# Patient Record
Sex: Male | Born: 1940 | Race: White | Hispanic: No | Marital: Single | Smoking: Former smoker
Health system: Southern US, Community
[De-identification: ages and names within clinical notes are randomized; demographics above are authoritative.]

## PROBLEM LIST (undated history)

## (undated) DIAGNOSIS — I2510 Atherosclerotic heart disease of native coronary artery without angina pectoris: Secondary | ICD-10-CM

## (undated) DIAGNOSIS — R001 Bradycardia, unspecified: Secondary | ICD-10-CM

## (undated) DIAGNOSIS — C801 Malignant (primary) neoplasm, unspecified: Secondary | ICD-10-CM

## (undated) DIAGNOSIS — I10 Essential (primary) hypertension: Secondary | ICD-10-CM

## (undated) DIAGNOSIS — Z87898 Personal history of other specified conditions: Secondary | ICD-10-CM

## (undated) DIAGNOSIS — E7800 Pure hypercholesterolemia, unspecified: Secondary | ICD-10-CM

## (undated) DIAGNOSIS — IMO0001 Reserved for inherently not codable concepts without codable children: Secondary | ICD-10-CM

## (undated) DIAGNOSIS — Z8719 Personal history of other diseases of the digestive system: Secondary | ICD-10-CM

## (undated) DIAGNOSIS — E119 Type 2 diabetes mellitus without complications: Secondary | ICD-10-CM

## (undated) DIAGNOSIS — E041 Nontoxic single thyroid nodule: Secondary | ICD-10-CM

## (undated) HISTORY — PX: COLONOSCOPY WITH ESOPHAGOGASTRODUODENOSCOPY (EGD): SHX5779

## (undated) HISTORY — PX: COLON SURGERY: SHX602

## (undated) HISTORY — PX: CORONARY ANGIOPLASTY WITH STENT PLACEMENT: SHX49

## (undated) HISTORY — PX: OTHER SURGICAL HISTORY: SHX169

## (undated) HISTORY — PX: HERNIA REPAIR: SHX51

---

## 2008-01-30 ENCOUNTER — Ambulatory Visit: Payer: Self-pay | Admitting: Family Medicine

## 2009-11-28 ENCOUNTER — Ambulatory Visit: Payer: Self-pay | Admitting: Family Medicine

## 2010-01-17 ENCOUNTER — Ambulatory Visit: Payer: Self-pay | Admitting: Gastroenterology

## 2010-02-03 ENCOUNTER — Ambulatory Visit: Payer: Self-pay | Admitting: Gastroenterology

## 2010-08-01 ENCOUNTER — Ambulatory Visit: Payer: Self-pay | Admitting: Internal Medicine

## 2011-04-03 ENCOUNTER — Ambulatory Visit: Payer: Self-pay | Admitting: Family Medicine

## 2012-08-05 ENCOUNTER — Ambulatory Visit: Payer: Self-pay | Admitting: Ophthalmology

## 2012-11-17 ENCOUNTER — Ambulatory Visit: Payer: Self-pay | Admitting: Internal Medicine

## 2012-11-19 ENCOUNTER — Ambulatory Visit: Payer: Self-pay | Admitting: Internal Medicine

## 2012-11-20 LAB — BASIC METABOLIC PANEL
Anion Gap: 5 — ABNORMAL LOW (ref 7–16)
Calcium, Total: 8.7 mg/dL (ref 8.5–10.1)
Chloride: 105 mmol/L (ref 98–107)
Co2: 26 mmol/L (ref 21–32)
EGFR (African American): >60
Osmolality: 271 (ref 275–301)
Potassium: 4 mmol/L (ref 3.5–5.1)
Sodium: 136 mmol/L (ref 136–145)

## 2012-11-20 LAB — CK TOTAL AND CKMB (NOT AT ARMC): CK-MB: 6.4 ng/mL — ABNORMAL HIGH (ref 0.5–3.6)

## 2013-09-14 ENCOUNTER — Emergency Department (HOSPITAL_COMMUNITY)
Admission: EM | Admit: 2013-09-14 | Discharge: 2013-09-14 | Disposition: A | Discharge status: Home / Self Care | Payer: Medicare Other | Attending: Emergency Medicine | Admitting: Emergency Medicine

## 2013-09-14 ENCOUNTER — Emergency Department (HOSPITAL_COMMUNITY): Payer: Medicare Other

## 2013-09-14 ENCOUNTER — Encounter (HOSPITAL_COMMUNITY): Payer: Self-pay | Admitting: Emergency Medicine

## 2013-09-14 DIAGNOSIS — Z87891 Personal history of nicotine dependence: Secondary | ICD-10-CM | POA: Insufficient documentation

## 2013-09-14 DIAGNOSIS — E119 Type 2 diabetes mellitus without complications: Secondary | ICD-10-CM | POA: Insufficient documentation

## 2013-09-14 DIAGNOSIS — Z7982 Long term (current) use of aspirin: Secondary | ICD-10-CM | POA: Diagnosis not present

## 2013-09-14 DIAGNOSIS — Z9861 Coronary angioplasty status: Secondary | ICD-10-CM | POA: Insufficient documentation

## 2013-09-14 DIAGNOSIS — I10 Essential (primary) hypertension: Secondary | ICD-10-CM | POA: Insufficient documentation

## 2013-09-14 DIAGNOSIS — M7732 Calcaneal spur, left foot: Secondary | ICD-10-CM

## 2013-09-14 DIAGNOSIS — Z7902 Long term (current) use of antithrombotics/antiplatelets: Secondary | ICD-10-CM | POA: Insufficient documentation

## 2013-09-14 DIAGNOSIS — M79609 Pain in unspecified limb: Secondary | ICD-10-CM | POA: Diagnosis present

## 2013-09-14 DIAGNOSIS — M7730 Calcaneal spur, unspecified foot: Secondary | ICD-10-CM | POA: Insufficient documentation

## 2013-09-14 DIAGNOSIS — Z79899 Other long term (current) drug therapy: Secondary | ICD-10-CM | POA: Diagnosis not present

## 2013-09-14 HISTORY — DX: Essential (primary) hypertension: I10

## 2013-09-14 HISTORY — DX: Type 2 diabetes mellitus without complications: E11.9

## 2013-09-14 MED ORDER — TRAMADOL HCL 50 MG PO TABS: 50.0000 mg | ORAL_TABLET | Freq: Four times a day (QID) | ORAL | Status: DC | PRN

## 2013-09-14 NOTE — ED Notes (Signed)
Pt reports left foot x 1 month, states he has been seen by PCP and given "exercises" to do to help pain. Pt states pain has continued to get worse. Pt denies any recent injury

## 2013-09-14 NOTE — Discharge Instructions (Signed)
Heel Spur °A heel spur is a hook of bone that can form on the calcaneus (the heel bone and the largest bone of the foot). Heel spurs are often associated with plantar fasciitis and usually come in people who have had the problem for an extended period of time. The cause of the relationship is unknown. The pain associated with them is thought to be caused by an inflammation (soreness and redness) of the plantar fascia rather than the spur itself. The plantar fascia is a thick fibrous like tissue that runs from the calcaneus (heel bone) to the ball of the foot. This strong, tight tissue helps maintain the arch of your foot. It helps distribute the weight across your foot as you walk or run. Stresses placed on the plantar fascia can be tremendous. When it is inflamed normal activities become painful. Pain is worse in the morning after sleeping. After sleeping the plantar fascia is tight. The first movements stretch the fascia and this causes pain. As the tendon loosens, the pain usually gets better. It often returns with too much standing or walking.  °About 70% of patients with plantar fasciitis have a heel spur. About half of people without foot pain also have heel spurs. °DIAGNOSIS  °The diagnosis of a heel spur is made by X-ray. The X-ray shows a hook of bone protruding from the bottom of the calcaneus at the point where the plantar fascia is attached to the heel bone.  °TREATMENT °· It is necessary to find out what is causing the stretching of the plantar fascia. If the cause is over-pronation (flat feet), orthotics and proper foot ware may help. °· Stretching exercises, losing weight, wearing shoes that have a cushioned heel that absorbs shock, and elevating the heel with the use of a heel cradle, heel cup, or orthotics may all help. Heel cradles and heel cups provide extra comfort and cushion to the heel, and reduce the amount of shock to the sore area. °AVOIDING THE PAIN OF PLANTAR FASCIITIS AND HEEL  SPURS °· Consult a sports medicine professional before beginning a new exercise program. °· Walking programs offer a good workout. There is a lower chance of overuse injuries common to the runners. There is less impact and less jarring of the joints. °· Begin all new exercise programs slowly. If problems or pains develop, decrease the amount of time or distance until you are at a comfortable level. °· Wear good shoes and replace them regularly. °· Stretch your foot and the heel cords at the back of the ankle (Achilles tendons) both before and after exercise. °· Run or exercise on even surfaces that are not hard. For example, asphalt is better than pavement. °· Do not run barefoot on hard surfaces. °· If using a treadmill, vary the incline. °· Do not continue to workout if you have foot or joint problems. Seek professional help if they do not improve. °HOME CARE INSTRUCTIONS  °· Avoid activities that cause you pain until you recover. °· Use ice or cold packs to the problem or painful areas after working out. °· Only take over-the-counter or prescription medicines for pain, discomfort, or fever as directed by your caregiver. °· Soft shoe inserts or athletic shoes with air or gel sole cushions may be helpful. °· If problems continue or become more severe, consult a sports medicine caregiver. Cortisone is a potent anti-inflammatory medication that may be injected into the painful area. You can discuss this treatment with your caregiver. °MAKE SURE YOU:  °·   Understand these instructions.  Will watch your condition.  Will get help right away if you are not doing well or get worse. Document Released: 02/21/2005 Document Revised: 04/09/2011 Document Reviewed: 03/18/2013 [employer] Patient Information 2015 [employer]. This information is not intended to replace advice given to you by your health care provider. Make sure you discuss any questions you have with your health care provider.  

## 2013-09-15 NOTE — ED Provider Notes (Signed)
Medical screening examination/treatment/procedure(s) were performed by non-physician practitioner and as supervising physician I was immediately available for consultation/collaboration.   EKG Interpretation None        Merryl Hacker, MD 09/15/13 2024

## 2013-09-15 NOTE — ED Provider Notes (Signed)
CSN: [id number]     Arrival date & time 09/14/13  1117 History   First MD Initiated Contact with Patient 09/14/13 1136     Chief Complaint  Patient presents with  . Foot Pain    left foot     (Consider location/radiation/quality/duration/timing/severity/associated sxs/prior Treatment) Patient is a 73 y.o. male presenting with lower extremity pain. The history is provided by the patient.  Foot Pain This is a new problem. Associated symptoms include arthralgias. Pertinent negatives include no chills, fever or joint swelling.   ROONEY SWAILS is a 73 y.o. male who presents to the Emergency Department complaining of left heel pain for one month.  He states he has been doing exercises to stretch his foot w/o relief.  He states the pain is worse in the morning and with excessive standing.  He denies injury, redness , swelling, open wound to the foot, numbness or pain proximal to the foot.     Past Medical History  Diagnosis Date  . Hypertension   . Diabetes mellitus without complication    Past Surgical History  Procedure Laterality Date  . Coronary angioplasty with stent placement    . Colon surgery     No family history on file. History  Substance Use Topics  . Smoking status: Former Smoker    Quit date: 09/01/1992  . Smokeless tobacco: Not on file  . Alcohol Use: No    Review of Systems  Constitutional: Negative for fever and chills.  Musculoskeletal: Positive for arthralgias. Negative for back pain and joint swelling.       Left heel pain  Skin: Negative for color change and wound.  All other systems reviewed and are negative.     Allergies  Review of patient's allergies indicates no known allergies.  Home Medications   Prior to Admission medications   Medication Sig Start Date End Date Taking? Authorizing Provider  aspirin EC 81 MG tablet Take 81 mg by mouth daily.   Yes Historical Provider, MD  atorvastatin (LIPITOR) 40 MG tablet Take 1 tablet by mouth at  bedtime.   Yes Historical Provider, MD  clopidogrel (PLAVIX) 75 MG tablet Take 1 tablet by mouth daily.   Yes Historical Provider, MD  docusate sodium (COLACE) 100 MG capsule Take 100 mg by mouth daily.    Yes Historical Provider, MD  enalapril (VASOTEC) 10 MG tablet Take 10 mg by mouth daily.   Yes Historical Provider, MD  metFORMIN (GLUMETZA) 500 MG (MOD) 24 hr tablet Take 500 mg by mouth every evening.    Yes Historical Provider, MD  tamsulosin (FLOMAX) 0.4 MG CAPS capsule Take 1 capsule by mouth daily. 07/03/13 07/03/14 Yes Historical Provider, MD  traMADol (ULTRAM) 50 MG tablet Take 1 tablet (50 mg total) by mouth every 6 (six) hours as needed. 09/14/13   Elizah Mierzwa L. Jane Broughton, PA-C   BP 143/65  Pulse 62  Temp(Src) 98 F (36.7 C) (Oral)  Resp 18  Ht 5\' 10"  (1.778 m)  Wt 212 lb (96.163 kg)  BMI 30.42 kg/m2  SpO2 97% Physical Exam  Nursing note and vitals reviewed. Constitutional: He is oriented to person, place, and time. He appears well-developed and well-nourished. No distress.  HENT:  Head: Normocephalic and atraumatic.  Cardiovascular: Normal rate, regular rhythm, normal heart sounds and intact distal pulses.   Pulmonary/Chest: Effort normal and breath sounds normal.  Musculoskeletal: He exhibits tenderness. He exhibits no edema.  Localized ttp of the plantar surface of the left heel.  ROM  is preserved.  DP pulse is brisk,distal sensation intact.  No erythema, edema, bruising or bony deformity.  No proximal tenderness.  Neurological: He is alert and oriented to person, place, and time. He exhibits normal muscle tone. Coordination normal.  Skin: Skin is warm and dry.    ED Course  Procedures (including critical care time) Labs Review Labs Reviewed - No data to display  Imaging Review Dg Foot Complete Left  09/14/2013   CLINICAL DATA:  LEFT foot pain with no known injury. Plantar fasciitis.  EXAM: LEFT FOOT - COMPLETE 3+ VIEW  COMPARISON:  None.  FINDINGS: There is no evidence of  fracture or dislocation. There is no evidence of arthropathy or other focal bone abnormality. Soft tissues are unremarkable except for vascular calcification. Incidental plantar and calcaneal spurs.  IMPRESSION: No acute findings.  Plantar and calcaneal spurs.   Electronically Signed   By: Rolla Flatten M.D.   On: 09/14/2013 12:04     EKG Interpretation None      MDM   Final diagnoses:  Heel spur, left    Patient is well appearing, ambulates with steady gait.  Has point tenderness to the plantar surface of the heel.  No concerning sx's for cellulitis.  Pt agrees to symptomatic treatment and referral info given for podiatry  Patient agrees to arrange f/u and appears stable for d/c    Amiaya Mcneeley L. Vanessa [place], PA-C 09/15/13 1943

## 2014-05-19 ENCOUNTER — Emergency Department
Admit: 2014-05-19 | Disposition: A | Discharge status: Home / Self Care | Payer: Self-pay | Admitting: Emergency Medicine

## 2015-04-01 ENCOUNTER — Other Ambulatory Visit: Payer: Self-pay | Admitting: Gastroenterology

## 2015-04-01 DIAGNOSIS — R1310 Dysphagia, unspecified: Secondary | ICD-10-CM

## 2015-04-07 ENCOUNTER — Ambulatory Visit
Admission: RE | Admit: 2015-04-07 | Discharge: 2015-04-07 | Disposition: A | Discharge status: Home / Self Care | Payer: Medicaid Other | Source: Ambulatory Visit | Attending: Gastroenterology | Admitting: Gastroenterology

## 2015-04-07 DIAGNOSIS — R1310 Dysphagia, unspecified: Secondary | ICD-10-CM | POA: Insufficient documentation

## 2015-05-24 ENCOUNTER — Ambulatory Visit
Admission: RE | Admit: 2015-05-24 | Discharge: 2015-05-24 | Disposition: A | Discharge status: Home / Self Care | Payer: Medicare Other | Source: Ambulatory Visit | Attending: Gastroenterology | Admitting: Gastroenterology

## 2015-05-24 ENCOUNTER — Encounter
Admission: RE | Disposition: A | Discharge status: Home / Self Care | Payer: Self-pay | Source: Ambulatory Visit | Attending: Gastroenterology

## 2015-05-24 DIAGNOSIS — Z5309 Procedure and treatment not carried out because of other contraindication: Secondary | ICD-10-CM | POA: Diagnosis not present

## 2015-05-24 DIAGNOSIS — Z86010 Personal history of colonic polyps: Secondary | ICD-10-CM | POA: Diagnosis present

## 2015-05-24 DIAGNOSIS — Z7902 Long term (current) use of antithrombotics/antiplatelets: Secondary | ICD-10-CM | POA: Insufficient documentation

## 2015-05-24 DIAGNOSIS — Z7982 Long term (current) use of aspirin: Secondary | ICD-10-CM | POA: Diagnosis not present

## 2015-05-24 SURGERY — COLONOSCOPY WITH PROPOFOL
Anesthesia: General

## 2015-05-24 NOTE — Brief Op Note (Signed)
[email or web address] in for EGD and COLONOSCOPY but did not stop Plavix or aspirin.Pt.is rescheduled for 4-5 weeks./V.Kida Digiulio,R.N.

## 2015-06-25 ENCOUNTER — Observation Stay
Admission: EM | Admit: 2015-06-25 | Discharge: 2015-06-25 | Disposition: A | Discharge status: Home / Self Care | Payer: Medicare Other | Attending: Internal Medicine | Admitting: Internal Medicine

## 2015-06-25 DIAGNOSIS — T783XXA Angioneurotic edema, initial encounter: Principal | ICD-10-CM | POA: Insufficient documentation

## 2015-06-25 DIAGNOSIS — E119 Type 2 diabetes mellitus without complications: Secondary | ICD-10-CM | POA: Diagnosis not present

## 2015-06-25 DIAGNOSIS — Z9889 Other specified postprocedural states: Secondary | ICD-10-CM | POA: Insufficient documentation

## 2015-06-25 DIAGNOSIS — Z87891 Personal history of nicotine dependence: Secondary | ICD-10-CM | POA: Diagnosis not present

## 2015-06-25 DIAGNOSIS — Z79899 Other long term (current) drug therapy: Secondary | ICD-10-CM | POA: Insufficient documentation

## 2015-06-25 DIAGNOSIS — I10 Essential (primary) hypertension: Secondary | ICD-10-CM | POA: Diagnosis not present

## 2015-06-25 DIAGNOSIS — Z7982 Long term (current) use of aspirin: Secondary | ICD-10-CM | POA: Insufficient documentation

## 2015-06-25 DIAGNOSIS — X58XXXA Exposure to other specified factors, initial encounter: Secondary | ICD-10-CM | POA: Diagnosis not present

## 2015-06-25 DIAGNOSIS — Z955 Presence of coronary angioplasty implant and graft: Secondary | ICD-10-CM | POA: Insufficient documentation

## 2015-06-25 DIAGNOSIS — T464X5A Adverse effect of angiotensin-converting-enzyme inhibitors, initial encounter: Secondary | ICD-10-CM | POA: Diagnosis not present

## 2015-06-25 DIAGNOSIS — Z7984 Long term (current) use of oral hypoglycemic drugs: Secondary | ICD-10-CM | POA: Diagnosis not present

## 2015-06-25 LAB — CBC
HEMATOCRIT: 42.8 % (ref 40.0–52.0)
Hemoglobin: 14.7 g/dL (ref 13.0–18.0)
MCH: 30.4 pg (ref 26.0–34.0)
MCHC: 34.5 g/dL (ref 32.0–36.0)
MCV: 88.3 fL (ref 80.0–100.0)
PLATELETS: 140 10*3/uL — AB (ref 150–440)
RBC: 4.84 MIL/uL (ref 4.40–5.90)
RDW: 13.4 % (ref 11.5–14.5)
WBC: 6.1 10*3/uL (ref 3.8–10.6)

## 2015-06-25 LAB — BASIC METABOLIC PANEL
Anion gap: 6 (ref 5–15)
BUN: 14 mg/dL (ref 6–20)
CALCIUM: 8.7 mg/dL — AB (ref 8.9–10.3)
CO2: 26 mmol/L (ref 22–32)
Chloride: 107 mmol/L (ref 101–111)
Creatinine, Ser: 0.87 mg/dL (ref 0.61–1.24)
GFR calc Af Amer: >60 mL/min (ref >60)
GLUCOSE: 130 mg/dL — AB (ref 65–99)
POTASSIUM: 3.6 mmol/L (ref 3.5–5.1)
SODIUM: 139 mmol/L (ref 135–145)

## 2015-06-25 MED ORDER — DIPHENHYDRAMINE HCL 50 MG/ML IJ SOLN: 50.0000 mg | Freq: Once | INTRAMUSCULAR | Status: DC

## 2015-06-25 MED ORDER — FAMOTIDINE IN NACL 20-0.9 MG/50ML-% IV SOLN
INTRAVENOUS | Status: AC
  Administered 2015-06-25: 20 mg
  Filled 2015-06-25: qty 50

## 2015-06-25 MED ORDER — DIPHENHYDRAMINE HCL 50 MG/ML IJ SOLN: 25.0000 mg | Freq: Three times a day (TID) | INTRAMUSCULAR | Status: DC | PRN

## 2015-06-25 MED ORDER — ONDANSETRON HCL 4 MG PO TABS: 4.0000 mg | ORAL_TABLET | Freq: Four times a day (QID) | ORAL | Status: DC | PRN

## 2015-06-25 MED ORDER — PNEUMOCOCCAL VAC POLYVALENT 25 MCG/0.5ML IJ INJ: 0.5000 mL | INJECTION | INTRAMUSCULAR | Status: DC

## 2015-06-25 MED ORDER — METHYLPREDNISOLONE SODIUM SUCC 125 MG IJ SOLR: 125.0000 mg | INTRAMUSCULAR | Status: DC

## 2015-06-25 MED ORDER — HYDRALAZINE HCL 25 MG PO TABS: 25.0000 mg | ORAL_TABLET | Freq: Three times a day (TID) | ORAL | Status: DC

## 2015-06-25 MED ORDER — EPINEPHRINE HCL 1 MG/ML IJ SOLN
0.1000 mg | Freq: Once | INTRAMUSCULAR | Status: AC
  Administered 2015-06-25: 0.1 mg via INTRAMUSCULAR
  Filled 2015-06-25: qty 1

## 2015-06-25 MED ORDER — SODIUM CHLORIDE 0.9 % IV SOLN
INTRAVENOUS | Status: DC
  Administered 2015-06-25: 06:00:00 via INTRAVENOUS

## 2015-06-25 MED ORDER — METHYLPREDNISOLONE SODIUM SUCC 125 MG IJ SOLR
INTRAMUSCULAR | Status: AC
  Administered 2015-06-25: 125 mg
  Filled 2015-06-25: qty 2

## 2015-06-25 MED ORDER — PREDNISONE 20 MG PO TABS: 20.0000 mg | ORAL_TABLET | Freq: Two times a day (BID) | ORAL | Status: DC

## 2015-06-25 MED ORDER — ONDANSETRON HCL 4 MG/2ML IJ SOLN: 4.0000 mg | Freq: Four times a day (QID) | INTRAMUSCULAR | Status: DC | PRN

## 2015-06-25 MED ORDER — EPINEPHRINE HCL 0.1 MG/ML IJ SOSY
PREFILLED_SYRINGE | INTRAMUSCULAR | Status: AC
  Filled 2015-06-25: qty 10

## 2015-06-25 MED ORDER — DIPHENHYDRAMINE HCL 25 MG PO TABS: 25.0000 mg | ORAL_TABLET | Freq: Four times a day (QID) | ORAL | Status: DC | PRN

## 2015-06-25 MED ORDER — DIPHENHYDRAMINE HCL 50 MG/ML IJ SOLN
INTRAMUSCULAR | Status: AC
  Administered 2015-06-25: 50 mg
  Filled 2015-06-25: qty 1

## 2015-06-25 MED ORDER — FAMOTIDINE IN NACL 20-0.9 MG/50ML-% IV SOLN: 20.0000 mg | Freq: Once | INTRAVENOUS | Status: DC

## 2015-06-25 MED ORDER — ENOXAPARIN SODIUM 40 MG/0.4ML ~~LOC~~ SOLN
40.0000 mg | Freq: Every day | SUBCUTANEOUS | Status: DC
  Administered 2015-06-25: 09:00:00 40 mg via SUBCUTANEOUS
  Filled 2015-06-25: qty 0.4

## 2015-06-25 MED ORDER — METHYLPREDNISOLONE SODIUM SUCC 125 MG IJ SOLR
125.0000 mg | Freq: Four times a day (QID) | INTRAMUSCULAR | Status: DC
  Administered 2015-06-25: 09:00:00 125 mg via INTRAVENOUS
  Filled 2015-06-25: qty 2

## 2015-06-25 NOTE — Progress Notes (Signed)
VSS, speach clear, underside of tongue swollen, no pain reported.  Pt settled in room with no complaints.  Oriented to room.

## 2015-06-25 NOTE — ED Notes (Signed)
Report from teressa, rn.

## 2015-06-25 NOTE — H&P (Signed)
[facility] [place] NAME: Francisco Reid    MR#:  [id number]  DATE OF BIRTH:  11-17-1940  DATE OF ADMISSION:  06/25/2015  PRIMARY CARE PHYSICIAN: Dion Body, MD   REQUESTING/REFERRING PHYSICIAN:   CHIEF COMPLAINT:   Chief Complaint  Patient presents with  . Oral Swelling    HISTORY OF PRESENT ILLNESS: Francisco Reid  is a 75 y.o. male with a known history of Hypertension ,diabetes mellitus presented to the emergency room with a swelling of the tongue since last night. Patient usually takes ACE inhibitor for hypertension and has been taking for a long time. But yesterday night patient noticed increased swelling of the tongue and he came to the emergency room. Patient says he has no difficulty swallowing and also no difficulty breathing. No complaints of chest pain. No history of fever or chills or cough. No history of recent travel. Patient was seen by ENT doctor in the emergency room upon request by ER physician. Laryngoscopy was done and base of tongue was swollen and airway was patent. Pharyngeal structures were normal. Hospitalist service was consulted for further care.  PAST MEDICAL HISTORY:   Past Medical History  Diagnosis Date  . Hypertension   . Diabetes mellitus without complication     PAST SURGICAL HISTORY:  Past Surgical History  Procedure Laterality Date  . Coronary angioplasty with stent placement    . Colon surgery      SOCIAL HISTORY:  Social History  Substance Use Topics  . Smoking status: Former Smoker    Quit date: 09/01/1992  . Smokeless tobacco: Not on file  . Alcohol Use: No    FAMILY HISTORY: No family history on file.  DRUG ALLERGIES: No Known Allergies  REVIEW OF SYSTEMS:   CONSTITUTIONAL: No fever, fatigue or weakness.  EYES: No blurred or double vision.  EARS, NOSE, AND THROAT: No tinnitus or ear pain. Swelling of tongue noted. RESPIRATORY: No cough, shortness of breath, wheezing or  hemoptysis.  CARDIOVASCULAR: No chest pain, orthopnea, edema.  GASTROINTESTINAL: No nausea, vomiting, diarrhea or abdominal pain.  GENITOURINARY: No dysuria, hematuria.  ENDOCRINE: No polyuria, nocturia,  HEMATOLOGY: No anemia, easy bruising or bleeding SKIN: No rash or lesion. MUSCULOSKELETAL: No joint pain or arthritis.   NEUROLOGIC: No tingling, numbness, weakness.  PSYCHIATRY: No anxiety or depression.   MEDICATIONS AT HOME:  Prior to Admission medications   Medication Sig Start Date End Date Taking? Authorizing Provider  aspirin EC 81 MG tablet Take 81 mg by mouth daily.   Yes Historical Provider, MD  atorvastatin (LIPITOR) 40 MG tablet Take 1 tablet by mouth at bedtime.   Yes Historical Provider, MD  enalapril (VASOTEC) 10 MG tablet Take 10 mg by mouth daily.   Yes Historical Provider, MD  guaiFENesin-codeine (ROBITUSSIN AC) 100-10 MG/5ML syrup Take 5 mLs by mouth every 4 (four) hours as needed for cough.   Yes Historical Provider, MD  metFORMIN (GLUMETZA) 500 MG (MOD) 24 hr tablet Take 500 mg by mouth every evening.    Yes Historical Provider, MD  tamsulosin (FLOMAX) 0.4 MG CAPS capsule Take 0.4 mg by mouth daily.   Yes Historical Provider, MD      PHYSICAL EXAMINATION:   VITAL SIGNS: Blood pressure 140/72, pulse 60, temperature 98.1 F (36.7 C), temperature source Oral, resp. rate 15, height 5\' 10"  (1.778 m), weight 92.08 kg (203 lb), SpO2 93 %.  GENERAL:  75 y.o.-year-old patient lying in the bed with no acute  distress.  EYES: Pupils equal, round, reactive to light and accommodation. No scleral icterus. Extraocular muscles intact.  HEENT: Head atraumatic, normocephalic. Oropharynx and nasopharynx clear.Tongue swollen.  NECK:  Supple, no jugular venous distention. No thyroid enlargement, no tenderness.  LUNGS: Normal breath sounds bilaterally, no wheezing, rales,rhonchi or crepitation. No use of accessory muscles of respiration.  CARDIOVASCULAR: S1, S2 normal. No murmurs,  rubs, or gallops.  ABDOMEN: Soft, nontender, nondistended. Bowel sounds present. No organomegaly or mass.  EXTREMITIES: No pedal edema, cyanosis, or clubbing.  NEUROLOGIC: Cranial nerves II through XII are intact. Muscle strength 5/5 in all extremities. Sensation intact. Gait not checked.  PSYCHIATRIC: The patient is alert and oriented x 3.  SKIN: No obvious rash, lesion, or ulcer.   LABORATORY PANEL:   CBC No results for input(s): WBC, HGB, HCT, PLT, MCV, MCH, MCHC, RDW, LYMPHSABS, MONOABS, EOSABS, BASOSABS, BANDABS in the last 168 hours.  Invalid input(s): NEUTRABS, BANDSABD ------------------------------------------------------------------------------------------------------------------  Chemistries  No results for input(s): NA, K, CL, CO2, GLUCOSE, BUN, CREATININE, CALCIUM, MG, AST, ALT, ALKPHOS, BILITOT in the last 168 hours.  Invalid input(s): GFRCGP ------------------------------------------------------------------------------------------------------------------ CrCl cannot be calculated (Patient has no serum creatinine result on file.). ------------------------------------------------------------------------------------------------------------------ No results for input(s): TSH, T4TOTAL, T3FREE, THYROIDAB in the last 72 hours.  Invalid input(s): FREET3   Coagulation profile No results for input(s): INR, PROTIME in the last 168 hours. ------------------------------------------------------------------------------------------------------------------- No results for input(s): DDIMER in the last 72 hours. -------------------------------------------------------------------------------------------------------------------  Cardiac Enzymes No results for input(s): CKMB, TROPONINI, MYOGLOBIN in the last 168 hours.  Invalid input(s): CK ------------------------------------------------------------------------------------------------------------------ Invalid input(s):  POCBNP  ---------------------------------------------------------------------------------------------------------------  Urinalysis No results found for: COLORURINE, APPEARANCEUR, LABSPEC, PHURINE, GLUCOSEU, HGBUR, BILIRUBINUR, KETONESUR, PROTEINUR, UROBILINOGEN, NITRITE, LEUKOCYTESUR   RADIOLOGY: No results found.  EKG: Orders placed or performed in visit on 11/19/12  . EKG 12-Lead    IMPRESSION AND PLAN: 75 year old male patient with history of hypertension, diabetes mellitus presented to the emergency room with swelling of the tongue. Admitting diagnosis 1. Angioedema secondary to ACE inhibitor 2. Hypertension 3. Type 2 diabetes mellitus Treatment plan Admit Patient to medical floor observation bed IV fluid hydration IV Solu-Medrol 125 mg every 6 hourly IV Benadryl when necessary for itching DVT prophylaxis subcutaneous Lovenox Appreciate ENT evaluation.  All the records are reviewed and case discussed with ED provider. Management plans discussed with the patient, family and they are in agreement.  CODE STATUS:FULL Code Status History    This patient does not have a recorded code status. Please follow your organizational policy for patients in this situation.       TOTAL TIME TAKING CARE OF THIS PATIENT: 50 minutes.    Saundra Shelling M.D on 06/25/2015 at 4:40 AM  Between 7am to 6pm - Pager - [phone]  After 6pm go to [email or web address] - password [email or web address] [facility]  Office  [phone]  CC: Primary care physician; Dion Body, MD

## 2015-06-25 NOTE — Care Management Obs Status (Signed)
[facility] NOTIFICATION   Patient Details  Name: AHLIAS OYOLA MRN: [id number] Date of Birth: 11-Aug-1940   Medicare Observation Status Notification Given:  No (in observation less than 24 hours )    Ival Bible, RN 06/25/2015, 4:29 PM

## 2015-06-25 NOTE — Discharge Summary (Signed)
Francisco Reid, is a 75 y.o. male  DOB 11-Jan-1941  MRN [id number].  Admission date:  06/25/2015  Admitting Physician  Saundra Shelling, MD  Discharge Date:  06/25/2015   Primary MD  Dion Body, MD  Recommendations for primary care physician for things to follow:  With primary doctor in 3 days regarding blood pressure checkup.   Admission Diagnosis  Angioedema, initial encounter [T78.3XXA]   Discharge Diagnosis  Angioedema, initial encounter [T78.3XXA]    Principal Problem:   Angioedema      Past Medical History  Diagnosis Date  . Hypertension   . Diabetes mellitus without complication [facility])     Past Surgical History  Procedure Laterality Date  . Coronary angioplasty with stent placement    . Colon surgery         History of present illness and  Hospital Course:     Kindly see H&P for history of present illness and admission details, please review complete Labs, Consult reports and Test reports for all details in brief  HPI  from the history and physical done on the day of admission 75 year old male patient with difficulty breathing after he took lisinopril yesterday. Admitted for angioedema secondary to ACE inhibitor .   Hospital Course   #1 angioedema secondary to ACE inhibitor as: No respiratory failure. No rash. Noted tongue  swelling in the middle of the night seen by ENT  in the emergency room, patient had a diffuse edema on the floor of the mouth, remaining oral cavity lips, gums, oropharynx are within normal limits. Patient had a laryngoscopy done which showed mild swelling of the floor of the mouth and the tongue base but the remaining airways patent. Patient admitted to medical unit, started on IV steroids, Benadryl, IV hydration. Today he feels much better. Swelling under the tongue does appear.  Able to talk. Sinuses. No shortness of breath. He is eager to go home. Discharge home with  Prednisone, Benadryl for 2 more days.   2 .essential hypertension'the angioedema due to ACE inhibitor as: Start him on Hydrallazine  25 mg 3 times a day. Follow-Up with Primary Doctor in 3 Days. ACE Inhibitors are contraindicated due to angioedema. D/w patient #3 diabetes mellitus type 2: Continue metformin that he is taking before. Discharge Condition:  stable   Follow UP    His primary doctor in 3 days and check the blood pressure.  Discharge Instructions  and  Discharge Medications        Medication List    ASK your doctor about these medications        aspirin EC 81 MG tablet  Take 81 mg by mouth daily.     atorvastatin 40 MG tablet  Commonly known as:  LIPITOR  Take 1 tablet by mouth at bedtime.     enalapril 10 MG tablet  Commonly known as:  VASOTEC  Take 10 mg by mouth daily.     guaiFENesin-codeine 100-10 MG/5ML syrup  Commonly known as:  ROBITUSSIN AC  Take 5 mLs by mouth every 4 (four) hours as needed for cough.     metFORMIN 500 MG (MOD) 24 hr tablet  Commonly known as:  GLUMETZA  Take 500 mg by mouth every evening.     tamsulosin 0.4 MG Caps capsule  Commonly known as:  FLOMAX  Take 0.4 mg by mouth daily.          Diet and Activity recommendation: See Discharge Instructions above   Consults obtained - ENT  Major procedures and Radiology Reports - PLEASE review detailed and final reports for all details, in brief -      No results found.  Micro Results     No results found for this or any previous visit (from the past 240 hour(s)).     Today   Subjective:   Francisco Reid today has no headache,no chest abdominal pain,no new weakness tingling or numbness, feels much better wants to go home today.   Objective:   Blood pressure 160/77, pulse 63, temperature 98.1 F (36.7 C), temperature source Oral, resp. rate 13, height 5\' 10"  (1.778  m), weight 92.08 kg (203 lb), SpO2 92 %.  No intake or output data in the 24 hours ending 06/25/15 0936  Exam Awake Alert, Oriented x 3, No new F.N deficits, Normal affect [place].AT,PERRAL Supple Neck,No JVD, No cervical lymphadenopathy appriciated.  Symmetrical Chest wall movement, Good air movement bilaterally, CTAB RRR,No Gallops,Rubs or new Murmurs, No Parasternal Heave +ve B.Sounds, Abd Soft, Non tender, No organomegaly appriciated, No rebound -guarding or rigidity. No Cyanosis, Clubbing or edema, No new Rash or bruise  Data Review   CBC w Diff:  Lab Results  Component Value Date   WBC 6.1 06/25/2015   HGB 14.7 06/25/2015   HCT 42.8 06/25/2015   PLT 140* 06/25/2015    CMP:  Lab Results  Component Value Date   NA 139 06/25/2015   NA 136 11/20/2012   K 3.6 06/25/2015   K 4.0 11/20/2012   CL 107 06/25/2015   CL 105 11/20/2012   CO2 26 06/25/2015   CO2 26 11/20/2012   BUN 14 06/25/2015   BUN 10 11/20/2012   CREATININE 0.87 06/25/2015   CREATININE 0.94 11/20/2012  .   Total Time in preparing paper work, data evaluation and todays exam - 58 minutes  Biagio Snelson M.D on 06/25/2015 at 9:36 AM    Note: This dictation was prepared with Dragon dictation along with smaller phrase technology. Any transcriptional errors that result from this process are unintentional.

## 2015-06-25 NOTE — Procedures (Signed)
Complications: None EBL:  None Findings:  Nasal cavity and nasopharynx are normal.  Widely patent airway.  Mild swelling of the tongue base.  Hypopharynx and laryngeal structures are normal with normal vocal cord movement.  Airway is widely patent.  Procedure:  The transnasal flexible laryngoscope was advance through the left side of the nose to the nasopharynx.  The telescope was flexed downward and advanced to evaluate the oropharynx, hypopharynx, and larynx.  The findings are as above.  The telescope was removed with no complications.

## 2015-06-25 NOTE — ED Notes (Addendum)
Pt states he feels the same as he did when he came in - pt continues to be able to speak in complete sentences and appears to not be having difficulty breathing or shortness of breath at this time - pt tongue remains swollen and thick - pt is drowsy from the Benadryl - Lungs remain clear and no stridor heard at this time

## 2015-06-25 NOTE — ED Provider Notes (Signed)
[facility] Emergency Department Provider Note  ____________________________________________  Time seen: Approximately 2:06 AM  I have reviewed the triage vital signs and the nursing notes.   HISTORY  Chief Complaint Oral Swelling  EM caveat: Some limitations due to acuity and need for emergent evaluation due to oral pharyngeal edema and potential for airway compromise  HPI Francisco Reid is a 75 y.o. male presents forfeeling like his tongue and throat are swollen.  Reports he went home, felt fine after dinner and then woke up during the night with a feeling like his tongue was thick, swollen, and his neck is swollen. He did have some itching but no rash in his arms and legs. He has no allergies, does take blood pressure medications. This has never happened before. He is not experiencing any chest pain shortness  of breath fevers chills nausea or vomiting but reports it is hard to move his tongue and slightly difficult to swallow.   Past Medical History  Diagnosis Date  . Hypertension   . Diabetes mellitus without complication [facility])     Patient Active Problem List   Diagnosis Date Noted  . Angioedema 06/25/2015    Past Surgical History  Procedure Laterality Date  . Coronary angioplasty with stent placement    . Colon surgery      No current outpatient prescriptions on file.  Allergies Review of patient's allergies indicates no known allergies.  History reviewed. No pertinent family history.  Social History Social History  Substance Use Topics  . Smoking status: Former Smoker    Quit date: 09/01/1992  . Smokeless tobacco: None  . Alcohol Use: No    Review of Systems Constitutional: No fever/chills Eyes: No visual changes. ENT: No sore throatBut slightly difficult or thick feeling with swallowing. Cardiovascular: Denies chest pain. Respiratory: Denies shortness of breath. Gastrointestinal: No abdominal pain.  No nausea, no vomiting.   No diarrhea.  No constipation. Skin: Negative for rash.  10-point ROS otherwise negative.  ____________________________________________   PHYSICAL EXAM:  VITAL SIGNS: ED Triage Vitals  Enc Vitals Group     BP 06/25/15 0142 182/64 mmHg     Pulse Rate 06/25/15 0142 65     Resp 06/25/15 0142 22     Temp 06/25/15 0142 98.1 F (36.7 C)     Temp Source 06/25/15 0142 Oral     SpO2 06/25/15 0142 92 %     Weight 06/25/15 0142 203 lb (92.08 kg)     Height 06/25/15 0142 5\' 10"  (1.778 m)     Head Cir --      Peak Flow --      Pain Score 06/25/15 0151 3     Pain Loc --      Pain Edu? --      Excl. in [place]? --    Constitutional: Alert and oriented. Well appearing and in no acute distress. Eyes: Conjunctivae are normal. PERRL. EOMI. Head: Atraumatic. Nose: No congestion/rhinnorhea. Mouth/Throat: Mucous membranes are moist.  Oropharynx non-erythematous. Neck: No stridor.   Cardiovascular: Normal rate, regular rhythm. Grossly normal heart sounds.  Good peripheral circulation. Respiratory: Normal respiratory effort.  No retractions. Lungs CTAB. Gastrointestinal: Soft and nontender. No distention. No abdominal bruits. No CVA tenderness. Musculoskeletal: No lower extremity tenderness nor edema.  No joint effusions. Neurologic:  Normal speech and language. No gross focal neurologic deficits are appreciated. No gait instability. Skin:  Skin is warm, dry and intact. No rash noted. Psychiatric: Mood and affect are normal. Speech  and behavior are normal.  ____________________________________________   LABS (all labs ordered are listed, but only abnormal results are displayed)  Labs Reviewed  BASIC METABOLIC PANEL - Abnormal; Notable for the following:    Glucose, Bld 130 (*)    Calcium 8.7 (*)    All other components within normal limits  CBC - Abnormal; Notable for the following:    Platelets 140 (*)    All other components within normal limits    ____________________________________________  EKG   ____________________________________________  RADIOLOGY   ____________________________________________   PROCEDURES  Procedure(s) performed: None  Critical Care performed: Yes, see critical care note(s)  CRITICAL CARE Performed by: Delman Kitten   Total critical care time: 35 minutes  Critical care time was exclusive of separately billable procedures and treating other patients.  Critical care was necessary to treat or prevent imminent or life-threatening deterioration.  Critical care was time spent personally by me on the following activities: development of treatment plan with patient and/or surrogate as well as nursing, discussions with consultants, evaluation of patient's response to treatment, examination of patient, obtaining history from patient or surrogate, ordering and performing treatments and interventions, ordering and review of laboratory studies, ordering and review of radiographic studies, pulse oximetry and re-evaluation of patient's condition.  Patient has evidence of airway compromise including angioedema of the base of the tongue requiring rapid evaluation, treatment with traditional treatment for possible anaphylaxis versus also possible ACE inhibitor-induced. Consult emergently to your nose and throat, Dr. Sheppard Coil, who acknowledged and is coming to see the patient as of 2 AM. ____________________________________________   INITIAL IMPRESSION / ASSESSMENT AND PLAN / ED COURSE  Pertinent labs & imaging results that were available during my care of the patient were reviewed by me and considered in my medical decision making (see chart for details).  Patient presents with angioedema of the base of the tongue. No evidence of acute airway compromise after evaluation with ear nose and throat. Appears likely consistent with caused by ACE inhibitor, and we will discontinue. Advised admission for airway  observation by ear nose and throat, and will admit for same. Patient reports that his symptoms seem to be slowly improving.  Patient will be admitted for airway observation and further workup under the hospitalist service with ear nose and throat consult following. ____________________________________________   FINAL CLINICAL IMPRESSION(S) / ED DIAGNOSES  Final diagnoses:  Angioedema, initial encounter      Delman Kitten, MD 06/25/15 [phone]

## 2015-06-25 NOTE — Consult Note (Signed)
Francisco Reid, Francisco Reid [id number] 02/10/1940 Delman Kitten, MD  Reason for Consult: Angioedema  HPI: Mr. Sutley is a 75 year old male who presented to the emergency room with difficulty breathing/swallowing.  Started this evening.  Has a long history of hypertension and takes lisinopril.  He noticed that there was swelling under his tongue and presented to the emergency department.  No prior history of similar symptoms.  ENT was consulted for evaluation of the patient's airway.  Allergies: No Known Allergies  ROS: Review of systems normal other than 12 systems except per HPI.  PMH:  Past Medical History  Diagnosis Date  . Hypertension   . Diabetes mellitus without complication     FH: No family history on file.  SH:  Social History   Social History  . Marital Status: Single    Spouse Name: N/A  . Number of Children: N/A  . Years of Education: N/A   Occupational History  . Not on file.   Social History Main Topics  . Smoking status: Former Smoker    Quit date: 09/01/1992  . Smokeless tobacco: Not on file  . Alcohol Use: No  . Drug Use: No  . Sexual Activity: Not on file   Other Topics Concern  . Not on file   Social History Narrative  . No narrative on file    PSH:  Past Surgical History  Procedure Laterality Date  . Coronary angioplasty with stent placement    . Colon surgery      Physical  Exam: CN 2-12 grossly intact and symmetric. EAC/TMs normal BL. In the oral cavity, there is diffuse edema in the floor of mouth.  It is soft but boggy.  The remaining oral cavity, lips, gums, ororpharynx normal with no masses or lesions. Skin warm and dry. Nasal cavity without polyps or purulence. External nose and ears without masses or lesions. EOMI, PERRLA. Neck supple with no masses or lesions. No lymphadenopathy palpated. Thyroid normal with no masses.   A/P: 75 y/o male with angioedema  - TFL performed at the bedside.  Mild swelling in the floor of mouth and tongue base but  the remaining airway is patent.   - Admit to inpatient per ED for continued angioedema protocol   Winfred Burn 06/25/2015 3:37 AM

## 2015-06-25 NOTE — ED Notes (Signed)
Pt woke up and felt like his throat was closing off and his tongue was getting thick - He reports he is not short of breath - Pt thinks that the temperature in house could have caused it - Pt has started taking a new cold medication (Cheratussin AC) - At this time pt is breathing without difficulty and able to talk in complete sentences - Denies chest pain/nausea/vomiting

## 2015-06-25 NOTE — Progress Notes (Signed)
Pt given dc instructions, home meds from pharmacy. Told to call PMD on Monday. Went over medication regimen for home. Verbalizes understanding

## 2015-06-25 NOTE — ED Notes (Signed)
Pt continues to be able to speak in full sentences, no increase in oral swelling noted from first assessment at 0310. No resp distress noted.

## 2015-06-25 NOTE — ED Notes (Signed)
Patient reports woke and felt like his throat was swelling.  Patient also reports feels like his tounge is swelling.

## 2015-07-11 ENCOUNTER — Encounter: Payer: Self-pay | Admitting: *Deleted

## 2015-07-12 ENCOUNTER — Ambulatory Visit: Payer: Medicare Other | Admitting: Anesthesiology

## 2015-07-12 ENCOUNTER — Encounter
Admission: RE | Disposition: A | Discharge status: Home / Self Care | Payer: Self-pay | Source: Ambulatory Visit | Attending: Gastroenterology

## 2015-07-12 ENCOUNTER — Encounter: Payer: Self-pay | Admitting: Anesthesiology

## 2015-07-12 ENCOUNTER — Ambulatory Visit
Admission: RE | Admit: 2015-07-12 | Discharge: 2015-07-12 | Disposition: A | Discharge status: Home / Self Care | Payer: Medicare Other | Source: Ambulatory Visit | Attending: Gastroenterology | Admitting: Gastroenterology

## 2015-07-12 DIAGNOSIS — Z1211 Encounter for screening for malignant neoplasm of colon: Secondary | ICD-10-CM | POA: Diagnosis not present

## 2015-07-12 DIAGNOSIS — Z7982 Long term (current) use of aspirin: Secondary | ICD-10-CM | POA: Insufficient documentation

## 2015-07-12 DIAGNOSIS — Z86010 Personal history of colonic polyps: Secondary | ICD-10-CM | POA: Diagnosis present

## 2015-07-12 DIAGNOSIS — I2510 Atherosclerotic heart disease of native coronary artery without angina pectoris: Secondary | ICD-10-CM | POA: Diagnosis not present

## 2015-07-12 DIAGNOSIS — K269 Duodenal ulcer, unspecified as acute or chronic, without hemorrhage or perforation: Secondary | ICD-10-CM | POA: Insufficient documentation

## 2015-07-12 DIAGNOSIS — E7800 Pure hypercholesterolemia, unspecified: Secondary | ICD-10-CM | POA: Insufficient documentation

## 2015-07-12 DIAGNOSIS — K259 Gastric ulcer, unspecified as acute or chronic, without hemorrhage or perforation: Secondary | ICD-10-CM | POA: Insufficient documentation

## 2015-07-12 DIAGNOSIS — D123 Benign neoplasm of transverse colon: Secondary | ICD-10-CM | POA: Diagnosis not present

## 2015-07-12 DIAGNOSIS — K635 Polyp of colon: Secondary | ICD-10-CM | POA: Diagnosis not present

## 2015-07-12 DIAGNOSIS — Z79899 Other long term (current) drug therapy: Secondary | ICD-10-CM | POA: Insufficient documentation

## 2015-07-12 DIAGNOSIS — K5730 Diverticulosis of large intestine without perforation or abscess without bleeding: Secondary | ICD-10-CM | POA: Insufficient documentation

## 2015-07-12 DIAGNOSIS — E119 Type 2 diabetes mellitus without complications: Secondary | ICD-10-CM | POA: Insufficient documentation

## 2015-07-12 DIAGNOSIS — I10 Essential (primary) hypertension: Secondary | ICD-10-CM | POA: Insufficient documentation

## 2015-07-12 DIAGNOSIS — Z7984 Long term (current) use of oral hypoglycemic drugs: Secondary | ICD-10-CM | POA: Diagnosis not present

## 2015-07-12 DIAGNOSIS — R1310 Dysphagia, unspecified: Secondary | ICD-10-CM | POA: Insufficient documentation

## 2015-07-12 DIAGNOSIS — K2950 Unspecified chronic gastritis without bleeding: Secondary | ICD-10-CM | POA: Diagnosis not present

## 2015-07-12 DIAGNOSIS — K224 Dyskinesia of esophagus: Secondary | ICD-10-CM | POA: Insufficient documentation

## 2015-07-12 DIAGNOSIS — Z888 Allergy status to other drugs, medicaments and biological substances status: Secondary | ICD-10-CM | POA: Diagnosis not present

## 2015-07-12 DIAGNOSIS — Z85820 Personal history of malignant melanoma of skin: Secondary | ICD-10-CM | POA: Insufficient documentation

## 2015-07-12 DIAGNOSIS — K2210 Ulcer of esophagus without bleeding: Secondary | ICD-10-CM | POA: Insufficient documentation

## 2015-07-12 HISTORY — DX: Reserved for inherently not codable concepts without codable children: IMO0001

## 2015-07-12 HISTORY — DX: Nontoxic single thyroid nodule: E04.1

## 2015-07-12 HISTORY — DX: Pure hypercholesterolemia, unspecified: E78.00

## 2015-07-12 HISTORY — DX: Atherosclerotic heart disease of native coronary artery without angina pectoris: I25.10

## 2015-07-12 HISTORY — PX: COLONOSCOPY WITH PROPOFOL: SHX5780

## 2015-07-12 HISTORY — DX: Malignant (primary) neoplasm, unspecified: C80.1

## 2015-07-12 HISTORY — DX: Bradycardia, unspecified: R00.1

## 2015-07-12 HISTORY — PX: ESOPHAGOGASTRODUODENOSCOPY (EGD) WITH PROPOFOL: SHX5813

## 2015-07-12 HISTORY — DX: Personal history of other specified conditions: Z87.898

## 2015-07-12 HISTORY — DX: Personal history of other diseases of the digestive system: Z87.19

## 2015-07-12 LAB — GLUCOSE, CAPILLARY: Glucose-Capillary: 138 mg/dL — ABNORMAL HIGH (ref 65–99)

## 2015-07-12 SURGERY — COLONOSCOPY WITH PROPOFOL
Anesthesia: General

## 2015-07-12 MED ORDER — PROPOFOL 500 MG/50ML IV EMUL
INTRAVENOUS | Status: DC | PRN
  Administered 2015-07-12: 150 ug/kg/min via INTRAVENOUS

## 2015-07-12 MED ORDER — MIDAZOLAM HCL 5 MG/5ML IJ SOLN
INTRAMUSCULAR | Status: DC | PRN
  Administered 2015-07-12: 1 mg via INTRAVENOUS

## 2015-07-12 MED ORDER — PHENYLEPHRINE HCL 10 MG/ML IJ SOLN
INTRAMUSCULAR | Status: DC | PRN
  Administered 2015-07-12: 100 ug via INTRAVENOUS

## 2015-07-12 MED ORDER — FENTANYL CITRATE (PF) 100 MCG/2ML IJ SOLN
INTRAMUSCULAR | Status: DC | PRN
  Administered 2015-07-12: 50 ug via INTRAVENOUS

## 2015-07-12 MED ORDER — LIDOCAINE 2% (20 MG/ML) 5 ML SYRINGE
INTRAMUSCULAR | Status: DC | PRN
  Administered 2015-07-12: 40 mg via INTRAVENOUS

## 2015-07-12 MED ORDER — SODIUM CHLORIDE 0.9 % IV SOLN
INTRAVENOUS | Status: DC
  Administered 2015-07-12: 13:00:00 via INTRAVENOUS

## 2015-07-12 MED ORDER — PROPOFOL 10 MG/ML IV BOLUS
INTRAVENOUS | Status: DC | PRN
  Administered 2015-07-12: 50 mg via INTRAVENOUS
  Administered 2015-07-12: 30 mg via INTRAVENOUS
  Administered 2015-07-12: 100 mg via INTRAVENOUS

## 2015-07-12 NOTE — Transfer of Care (Signed)
Immediate Anesthesia Transfer of Care Note  Patient: Francisco Reid  Procedure(s) Performed: Procedure(s): COLONOSCOPY WITH PROPOFOL (N/A) ESOPHAGOGASTRODUODENOSCOPY (EGD) WITH PROPOFOL (N/A)  Patient Location: PACU and Endoscopy Unit  Anesthesia Type:General  Level of Consciousness: sedated  Airway & Oxygen Therapy: Patient Spontanous Breathing and Patient connected to nasal cannula oxygen  Post-op Assessment: Report given to RN and Post -op Vital signs reviewed and stable  Post vital signs: Reviewed and stable  Last Vitals:  Filed Vitals:   07/12/15 1248  BP: 157/67  Pulse: 63  Temp: 35.8 C  Resp: 20    Last Pain: There were no vitals filed for this visit.       Complications: No apparent anesthesia complications

## 2015-07-12 NOTE — Anesthesia Preprocedure Evaluation (Addendum)
Anesthesia Evaluation  Patient identified by MRN, date of birth, ID band Patient awake    Reviewed: Allergy & Precautions, NPO status , Patient's Chart, lab work & pertinent test results, reviewed documented beta blocker date and time   Airway Mallampati: II  TM Distance: >3 FB     Dental  (+) Chipped, Upper Dentures, Lower Dentures   Pulmonary shortness of breath, former smoker,           Cardiovascular hypertension, Pt. on medications + CAD       Neuro/Psych    GI/Hepatic   Endo/Other  diabetes, Type 2  Renal/GU      Musculoskeletal   Abdominal   Peds  Hematology   Anesthesia Other Findings Hx of bradycardia. Neck movement OK. Cardiac stent.  Reproductive/Obstetrics                            Anesthesia Physical Anesthesia Plan  ASA: III  Anesthesia Plan: General   Post-op Pain Management:    Induction: Intravenous  Airway Management Planned: Nasal Cannula  Additional Equipment:   Intra-op Plan:   Post-operative Plan:   Informed Consent: I have reviewed the patients History and Physical, chart, labs and discussed the procedure including the risks, benefits and alternatives for the proposed anesthesia with the patient or authorized representative who has indicated his/her understanding and acceptance.     Plan Discussed with: CRNA  Anesthesia Plan Comments:         Anesthesia Quick Evaluation

## 2015-07-12 NOTE — Op Note (Signed)
[facility] Gastroenterology Patient Name: Francisco Reid Procedure Date: 07/12/2015 1:19 PM MRN: [id number] Account #: [id number] Date of Birth: Jun 20, 1940 Admit Type: Outpatient Age: 75 Room: [facility] ENDO ROOM 3 Gender: Male Note Status: Finalized Procedure:            Colonoscopy Indications:          Personal history of colonic polyps Providers:            Lollie Sails, MD Referring MD:         Dion Body (Referring MD) Medicines:            Monitored Anesthesia Care Complications:        No immediate complications. Procedure:            Pre-Anesthesia Assessment:                       - ASA Grade Assessment: III - A patient with severe                        systemic disease.                       After obtaining informed consent, the colonoscope was                        passed under direct vision. Throughout the procedure,                        the patient's blood pressure, pulse, and oxygen                        saturations were monitored continuously. The                        Colonoscope was introduced through the anus and                        advanced to the the cecum, identified by appendiceal                        orifice and ileocecal valve. The colonoscopy was                        performed without difficulty. The patient tolerated the                        procedure well. The quality of the bowel preparation                        was fair. Findings:      A 3 mm polyp was found in the distal sigmoid colon. The polyp was       sessile. The polyp was removed with a cold snare. Resection and       retrieval were complete.      A 1 mm polyp was found in the distal transverse colon. The polyp was       flat. The polyp was removed with a cold biopsy forceps. Resection and       retrieval were complete.      Multiple small and large-mouthed diverticula were found in the sigmoid  colon, descending colon, transverse colon and  ascending colon.      The retroflexed view of the distal rectum and anal verge was normal and       showed no anal or rectal abnormalities.      The digital rectal exam was normal. Impression:           - Preparation of the colon was fair.                       - One 3 mm polyp in the distal sigmoid colon, removed                        with a cold snare. Resected and retrieved.                       - One 1 mm polyp in the distal transverse colon,                        removed with a cold biopsy forceps. Resected and                        retrieved.                       - Diverticulosis in the sigmoid colon, in the                        descending colon, in the transverse colon and in the                        ascending colon.                       - The distal rectum and anal verge are normal on                        retroflexion view. Recommendation:       - Discharge patient to home.                       - Await pathology results.                       - Telephone GI clinic for pathology results in 1 week.                       - Return to GI office in 4 weeks. Procedure Code(s):    --- Professional ---                       [phone], Colonoscopy, flexible; with removal of tumor(s),                        polyp(s), or other lesion(s) by snare technique                       [id number], 61, Colonoscopy, flexible; with biopsy, single                        or multiple Diagnosis Code(s):    --- Professional ---  D12.5, Benign neoplasm of sigmoid colon                       D12.3, Benign neoplasm of transverse colon (hepatic                        flexure or splenic flexure)                       Z86.010, Personal history of colonic polyps                       K57.30, Diverticulosis of large intestine without                        perforation or abscess without bleeding CPT copyright 2016 American Medical Association. All rights reserved. The codes documented in  this report are preliminary and upon coder review may  be revised to meet current compliance requirements. Lollie Sails, MD 07/12/2015 2:17:19 PM This report has been signed electronically. Number of Addenda: 0 Note Initiated On: 07/12/2015 1:19 PM Scope Withdrawal Time: 0 hours 8 minutes 31 seconds  Total Procedure Duration: 0 hours 19 minutes 28 seconds       [facility]

## 2015-07-12 NOTE — Op Note (Signed)
[facility] Gastroenterology Patient Name: Francisco Reid Procedure Date: 07/12/2015 1:20 PM MRN: [id number] Account #: [id number] Date of Birth: [birth year] Admit Type: Outpatient Age: 75 Room: [facility] ENDO ROOM 3 Gender: Male Note Status: Finalized Procedure:            Upper GI endoscopy Indications:          Dysphagia Providers:            Lollie Sails, MD Referring MD:         Dion Body (Referring MD) Medicines:            Monitored Anesthesia Care Complications:        No immediate complications. Procedure:            Pre-Anesthesia Assessment:                       - ASA Grade Assessment: III - A patient with severe                        systemic disease.                       After obtaining informed consent, the endoscope was                        passed under direct vision. Throughout the procedure,                        the patient's blood pressure, pulse, and oxygen                        saturations were monitored continuously. The Endoscope                        was introduced through the mouth, and advanced to the                        fourth part of duodenum. The upper GI endoscopy was                        accomplished without difficulty. The patient tolerated                        the procedure well. Findings:      LA Grade B (one or more mucosal breaks greater than 5 mm, not extending       between the tops of two mucosal folds) esophagitis with no bleeding was       found. Biopsies were taken with a cold forceps for histology.      Abnormal motility was noted in the middle third of the esophagus and in       the lower third of the esophagus. The cricopharyngeus was normal. There       is spasticity and extra peristaltic waves of the esophageal body.       Tertiary peristaltic waves are noted.      The exam of the esophagus was otherwise normal.      Patchy mild inflammation characterized by congestion (edema), erythema   was found in the gastric antrum and linear erosion noted in the upper       body of the stomach. Biopsies were taken with a  cold forceps for       histology. Biopsies were taken with a cold forceps for histology and       Helicobacter pylori testing.      One non-bleeding superficial duodenal ulcer with no stigmata of bleeding       was found in the duodenal bulb. The lesion was 3 mm in largest dimension.      The exam of the duodenum was otherwise normal.      The cardia and gastric fundus were normal on retroflexion. Impression:           - LA Grade B erosive esophagitis. Biopsied.                       - Abnormal esophageal motility.                       - Gastritis. Biopsied.                       - One non-bleeding duodenal ulcer with no stigmata of                        bleeding. Recommendation:       - Await pathology results.                       - Use Protonix (pantoprazole) 40 mg PO BID for 6 weeks.                       - Use Protonix (pantoprazole) 40 mg PO daily daily.                       - Repeat upper endoscopy in 7 weeks to check healing. Procedure Code(s):    --- Professional ---                       [phone], Esophagogastroduodenoscopy, flexible, transoral;                        with biopsy, single or multiple Diagnosis Code(s):    --- Professional ---                       K20.8, Other esophagitis                       K22.4, Dyskinesia of esophagus                       K29.70, Gastritis, unspecified, without bleeding                       K26.9, Duodenal ulcer, unspecified as acute or chronic,                        without hemorrhage or perforation                       R13.10, Dysphagia, unspecified CPT copyright 2016 American Medical Association. All rights reserved. The codes documented in this report are preliminary and upon coder review may  be revised to meet current compliance requirements. Lollie Sails, MD 07/12/2015 1:48:48 PM This report has  been signed electronically. Number of Addenda: 0  Note Initiated On: 07/12/2015 1:20 PM      [facility]

## 2015-07-12 NOTE — H&P (Signed)
Outpatient short stay form Pre-procedure 07/12/2015 1:01 PM Lollie Sails MD  Primary Physician: Dr Dion Body  Reason for visit:  EGD and colonoscopy  History of present illness:  Patient is a 75 year old male presenting today for EGD and colonoscopy. He has a personal history of adenomatous colon polyps. He had an EGD on 02/03/2010 finding of abnormal motility throughout the esophagus. There is no evidence of fixed stricture. He also had an EGD on 09/22/2010 at [facility] which again showed the examined esophagus B normal stomach was also normal. He continues to have complaints of dysphagia with things occasionally sticking in the upper esophagus mostly things like pills. He has dysphagia to both solids as well as liquids. He had a barium swallow done on 04/07/2015 this showing prominent intermittent tertiary contractions no evidence of no evidence of a nonreducible hiatal hernia was no reflux noted. There was also noted that he had a very weak motility in the prone position. The barium tablet passed promptly from the mouth to the stomach in the upright position. 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 He has not had a modified barium swallow.    Current facility-administered medications:  .  0.9 %  sodium chloride infusion, , Intravenous, Continuous, Lollie Sails, MD  Prescriptions prior to admission  Medication Sig Dispense Refill Last Dose  . aspirin EC 81 MG tablet Take 81 mg by mouth daily.   06/24/2015 at 1000  . atorvastatin (LIPITOR) 40 MG tablet Take 1 tablet by mouth at bedtime.   06/24/2015 at 1800  . diphenhydrAMINE (BENADRYL) 25 MG tablet Take 1 tablet (25 mg total) by mouth every 6 (six) hours as needed. 10 tablet 0   . hydrALAZINE (APRESOLINE) 25 MG tablet Take 1 tablet (25 mg total) by mouth every 8 (eight) hours. 30 tablet 0   . metFORMIN (GLUMETZA) 500 MG (MOD) 24 hr tablet Take 500 mg by mouth every evening.    06/24/2015 at 1800  . predniSONE  (DELTASONE) 20 MG tablet Take 1 tablet (20 mg total) by mouth 2 (two) times daily with a meal. 10 tablet 0   . tamsulosin (FLOMAX) 0.4 MG CAPS capsule Take 0.4 mg by mouth daily.   06/24/2015 at 1800     Allergies  Allergen Reactions  . Enalapril      Past Medical History  Diagnosis Date  . Hypertension   . Diabetes mellitus without complication ([place])   . Chronic sinus bradycardia   . Coronary artery disease   . History of dysphagia   . Hypercholesterolemia   . Shortness of breath dyspnea   . Thyroid nodule   . Cancer ([place])     melanoma    Review of systems:      Physical Exam    Heart and lungs: Regular rate and rhythm without rub or gallop, lungs are bilaterally clear.    HEENT: Normocephalic atraumatic eyes are anicteric    Other:     Pertinant exam for procedure: Soft, nontender nondistended bowel sounds positive normoactive.    Planned proceedures: EGD, colonoscopy and indicated procedures. I have discussed the risks benefits and complications of procedures to include not limited to bleeding, infection, perforation and the risk of sedation and the patient wishes to proceed.    Lollie Sails, MD Gastroenterology 07/12/2015  1:01 PM

## 2015-07-12 NOTE — Anesthesia Postprocedure Evaluation (Signed)
Anesthesia Post Note  Patient: Francisco Reid  Procedure(s) Performed: Procedure(s) (LRB): COLONOSCOPY WITH PROPOFOL (N/A) ESOPHAGOGASTRODUODENOSCOPY (EGD) WITH PROPOFOL (N/A)  Patient location during evaluation: Endoscopy Anesthesia Type: General Level of consciousness: awake and alert Pain management: pain level controlled Vital Signs Assessment: post-procedure vital signs reviewed and stable Respiratory status: spontaneous breathing, nonlabored ventilation, respiratory function stable and patient connected to nasal cannula oxygen Cardiovascular status: blood pressure returned to baseline and stable Postop Assessment: no signs of nausea or vomiting Anesthetic complications: no    Last Vitals:  Filed Vitals:   07/12/15 1440 07/12/15 1450  BP: 137/62 147/61  Pulse: 56 51  Temp:    Resp: 15 15    Last Pain: There were no vitals filed for this visit.               Cheyrl Buley S

## 2015-07-13 ENCOUNTER — Encounter: Payer: Self-pay | Admitting: Gastroenterology

## 2015-07-14 LAB — SURGICAL PATHOLOGY

## 2015-12-07 ENCOUNTER — Emergency Department
Admission: EM | Admit: 2015-12-07 | Discharge: 2015-12-07 | Disposition: A | Discharge status: Home / Self Care | Payer: Medicare Other | Attending: Emergency Medicine | Admitting: Emergency Medicine

## 2015-12-07 DIAGNOSIS — I10 Essential (primary) hypertension: Secondary | ICD-10-CM | POA: Insufficient documentation

## 2015-12-07 DIAGNOSIS — Z85820 Personal history of malignant melanoma of skin: Secondary | ICD-10-CM | POA: Diagnosis not present

## 2015-12-07 DIAGNOSIS — Z87891 Personal history of nicotine dependence: Secondary | ICD-10-CM | POA: Insufficient documentation

## 2015-12-07 DIAGNOSIS — Z79899 Other long term (current) drug therapy: Secondary | ICD-10-CM | POA: Diagnosis not present

## 2015-12-07 DIAGNOSIS — I2510 Atherosclerotic heart disease of native coronary artery without angina pectoris: Secondary | ICD-10-CM | POA: Diagnosis not present

## 2015-12-07 DIAGNOSIS — Z7982 Long term (current) use of aspirin: Secondary | ICD-10-CM | POA: Insufficient documentation

## 2015-12-07 DIAGNOSIS — Z7952 Long term (current) use of systemic steroids: Secondary | ICD-10-CM | POA: Insufficient documentation

## 2015-12-07 DIAGNOSIS — R220 Localized swelling, mass and lump, head: Secondary | ICD-10-CM | POA: Diagnosis present

## 2015-12-07 DIAGNOSIS — T7840XA Allergy, unspecified, initial encounter: Secondary | ICD-10-CM | POA: Diagnosis not present

## 2015-12-07 DIAGNOSIS — Z7984 Long term (current) use of oral hypoglycemic drugs: Secondary | ICD-10-CM | POA: Diagnosis not present

## 2015-12-07 MED ORDER — METHYLPREDNISOLONE SODIUM SUCC 125 MG IJ SOLR
INTRAMUSCULAR | Status: AC
  Filled 2015-12-07: qty 2

## 2015-12-07 MED ORDER — METHYLPREDNISOLONE SODIUM SUCC 125 MG IJ SOLR
125.0000 mg | Freq: Once | INTRAMUSCULAR | Status: AC
  Administered 2015-12-07: 125 mg via INTRAVENOUS

## 2015-12-07 MED ORDER — EPINEPHRINE 0.3 MG/0.3ML IJ SOAJ: 0.3000 mg | Freq: Once | INTRAMUSCULAR | 2 refills | Status: AC

## 2015-12-07 MED ORDER — FAMOTIDINE IN NACL 20-0.9 MG/50ML-% IV SOLN
20.0000 mg | Freq: Once | INTRAVENOUS | Status: AC
  Administered 2015-12-07: 20 mg via INTRAVENOUS

## 2015-12-07 MED ORDER — FAMOTIDINE IN NACL 20-0.9 MG/50ML-% IV SOLN
INTRAVENOUS | Status: AC
  Filled 2015-12-07: qty 50

## 2015-12-07 MED ORDER — DIPHENHYDRAMINE HCL 25 MG PO CAPS: 50.0000 mg | ORAL_CAPSULE | Freq: Four times a day (QID) | ORAL | 0 refills | Status: DC | PRN

## 2015-12-07 NOTE — ED Notes (Signed)
ED Provider at bedside. 

## 2015-12-07 NOTE — ED Provider Notes (Signed)
[facility] Emergency Department Provider Note  ____________________________________________  Time seen: Approximately 7:00 PM  I have reviewed the triage vital signs and the nursing notes.   HISTORY  Chief Complaint Allergic Reaction    HPI Francisco Reid is a 75 y.o. male reports that around 6 PM he started having tongue swelling as well as some itching to the lower abdomen. No vomiting. No shortness of breath. This seemed to start after eating a peanut butter sandwich. He normally eats peanuts and has not had an allergy to this in the past. He previously been taken off of an ACE inhibitor due to suspected angioedema reaction. He went to the fire department tonight who gave him 50 g of IM Benadryl. His symptoms have rapidly improved since then.     Past Medical History:  Diagnosis Date  . Cancer ([place])    melanoma  . Chronic sinus bradycardia   . Coronary artery disease   . Diabetes mellitus without complication ([place])   . History of dysphagia   . Hypercholesterolemia   . Hypertension   . Shortness of breath dyspnea   . Thyroid nodule      Patient Active Problem List   Diagnosis Date Noted  . Angioedema 06/25/2015     Past Surgical History:  Procedure Laterality Date  . c-5 neck surgery    . COLON SURGERY    . COLONOSCOPY WITH ESOPHAGOGASTRODUODENOSCOPY (EGD)    . COLONOSCOPY WITH PROPOFOL N/A 07/12/2015   Procedure: COLONOSCOPY WITH PROPOFOL;  Surgeon: Lollie Sails, MD;  Location: [facility] ENDOSCOPY;  Service: Endoscopy;  Laterality: N/A;  . CORONARY ANGIOPLASTY WITH STENT PLACEMENT    . ESOPHAGOGASTRODUODENOSCOPY (EGD) WITH PROPOFOL N/A 07/12/2015   Procedure: ESOPHAGOGASTRODUODENOSCOPY (EGD) WITH PROPOFOL;  Surgeon: Lollie Sails, MD;  Location: [facility] ENDOSCOPY;  Service: Endoscopy;  Laterality: N/A;  . HERNIA REPAIR       Prior to Admission medications   Medication Sig Start Date End Date Taking? Authorizing Provider  aspirin EC  81 MG tablet Take 81 mg by mouth daily.    Historical Provider, MD  atorvastatin (LIPITOR) 40 MG tablet Take 1 tablet by mouth at bedtime.    Historical Provider, MD  diphenhydrAMINE (BENADRYL) 25 mg capsule Take 2 capsules (50 mg total) by mouth every 6 (six) hours as needed. 12/07/15   Carrie Mew, MD  EPINEPHrine 0.3 mg/0.3 mL IJ SOAJ injection Inject 0.3 mLs (0.3 mg total) into the muscle once. Follow package instructions as needed for severe allergy or anaphylactic reaction. 12/07/15 12/07/15  Carrie Mew, MD  hydrALAZINE (APRESOLINE) 25 MG tablet Take 1 tablet (25 mg total) by mouth every 8 (eight) hours. 06/25/15   Epifanio Lesches, MD  metFORMIN (GLUMETZA) 500 MG (MOD) 24 hr tablet Take 500 mg by mouth every evening.     Historical Provider, MD  predniSONE (DELTASONE) 20 MG tablet Take 1 tablet (20 mg total) by mouth 2 (two) times daily with a meal. 06/25/15   Epifanio Lesches, MD  tamsulosin (FLOMAX) 0.4 MG CAPS capsule Take 0.4 mg by mouth daily.    Historical Provider, MD     Allergies Enalapril   No family history on file.  Social History Social History  Substance Use Topics  . Smoking status: Former Smoker    Quit date: 09/01/1992  . Smokeless tobacco: Not on file  . Alcohol use No    Review of Systems  Constitutional:   No fever or chills.  ENT:   No sore throat.  No rhinorrhea.Positive tongue swelling Cardiovascular:   No chest pain. Respiratory:   No dyspnea or cough. Gastrointestinal:   Negative for abdominal pain, vomiting and diarrhea.  10-point ROS otherwise negative.  ____________________________________________   PHYSICAL EXAM:  VITAL SIGNS: ED Triage Vitals  Enc Vitals Group     BP 12/07/15 1851 (!) 169/89     Pulse Rate 12/07/15 1851 68     Resp 12/07/15 1851 18     Temp 12/07/15 1851 98.3 F (36.8 C)     Temp src --      SpO2 12/07/15 1851 100 %     Weight 12/07/15 1851 204 lb (92.5 kg)     Height 12/07/15 1851 5\' 10"  (1.778 m)      Head Circumference --      Peak Flow --      Pain Score 12/07/15 1856 0     Pain Loc --      Pain Edu? --      Excl. in [place]? --     Vital signs reviewed, nursing assessments reviewed.   Constitutional:   Alert and oriented. Well appearing and in no distress. Eyes:   No scleral icterus. No conjunctival pallor. PERRL. EOMI.  No nystagmus. ENT   Head:   Normocephalic and atraumatic.   Nose:   No congestion/rhinnorhea. No septal hematoma   Mouth/Throat:   MMM, no pharyngeal erythema. No peritonsillar mass. Tonsils slightly enlarged, but still Mallampati 1 view   Neck:   No stridor. No SubQ emphysema. No meningismus. Hematological/Lymphatic/Immunilogical:   No cervical lymphadenopathy. Cardiovascular:   RRR. Symmetric bilateral radial and DP pulses.  No murmurs.  Respiratory:   Normal respiratory effort without tachypnea nor retractions. Breath sounds are clear and equal bilaterally. No wheezes/rales/rhonchi. Gastrointestinal:   Soft and nontender. Non distended. There is no CVA tenderness.  No rebound, rigidity, or guarding. Genitourinary:   deferred Musculoskeletal:   Nontender with normal range of motion in all extremities. No joint effusions.  No lower extremity tenderness.  No edema. Neurologic:   Normal speech and language.  CN 2-10 normal. Motor grossly intact. No gross focal neurologic deficits are appreciated.  Skin:    Skin is warm, dry and intact slight amount of poorly defined erythema on the bilateral lower abdomen is nontender to the touch and not an urticarial.  ____________________________________________    LABS (pertinent positives/negatives) (all labs ordered are listed, but only abnormal results are displayed) Labs Reviewed - No data to display ____________________________________________   EKG    ____________________________________________     RADIOLOGY    ____________________________________________   PROCEDURES Procedures  ____________________________________________   INITIAL IMPRESSION / ASSESSMENT AND PLAN / ED COURSE  Pertinent labs & imaging results that were available during my care of the patient were reviewed by me and considered in my medical decision making (see chart for details).  Patient well appearing no acute distress. Vital signs unremarkable. Breathing comfortably. No evidence of airway compromise. Symptoms rapidly improving. We'll monitor the patient in the ED after getting Solu-Medrol and famotidine. Plan to discharge with continued Benadryl, EpiPen, follow up with primary care for continued allergy versus angioedema testing.     Clinical Course     ----------------------------------------- 8:19 PM on 12/07/2015 -----------------------------------------  Symptoms resolved. Patient is calm comfortable with unremarkable vital signs. We'll discharge home. ____________________________________________   FINAL CLINICAL IMPRESSION(S) / ED DIAGNOSES  Final diagnoses:  Allergic reaction, initial encounter  Tongue swelling       Portions of this note  were generated with dragon dictation software. Dictation errors may occur despite best attempts at proofreading.    Carrie Mew, MD 12/07/15 2019

## 2015-12-07 NOTE — ED Triage Notes (Signed)
Pt arrives to ER via EMS c/o allergic reaction, tongue swelling that occurred tonight approx 1800. Pt ate peanut butter sandwich then felt stomach swelling. No RR compromise. No hx of allergies. Pt given 50mg  benadryl IM.  Pt alert and oriented X4, active, cooperative, pt in NAD. RR even and unlabored, color WNL.

## 2017-08-02 ENCOUNTER — Encounter: Payer: Self-pay | Admitting: Emergency Medicine

## 2017-08-02 ENCOUNTER — Emergency Department: Payer: Medicare Other

## 2017-08-02 ENCOUNTER — Emergency Department
Admission: EM | Admit: 2017-08-02 | Discharge: 2017-08-02 | Disposition: A | Discharge status: Home / Self Care | Payer: Medicare Other | Attending: Emergency Medicine | Admitting: Emergency Medicine

## 2017-08-02 DIAGNOSIS — Z87891 Personal history of nicotine dependence: Secondary | ICD-10-CM | POA: Insufficient documentation

## 2017-08-02 DIAGNOSIS — S9032XA Contusion of left foot, initial encounter: Secondary | ICD-10-CM | POA: Diagnosis not present

## 2017-08-02 DIAGNOSIS — I10 Essential (primary) hypertension: Secondary | ICD-10-CM | POA: Diagnosis not present

## 2017-08-02 DIAGNOSIS — Z7982 Long term (current) use of aspirin: Secondary | ICD-10-CM | POA: Insufficient documentation

## 2017-08-02 DIAGNOSIS — Z7902 Long term (current) use of antithrombotics/antiplatelets: Secondary | ICD-10-CM | POA: Diagnosis not present

## 2017-08-02 DIAGNOSIS — Y929 Unspecified place or not applicable: Secondary | ICD-10-CM | POA: Insufficient documentation

## 2017-08-02 DIAGNOSIS — E119 Type 2 diabetes mellitus without complications: Secondary | ICD-10-CM | POA: Insufficient documentation

## 2017-08-02 DIAGNOSIS — Z79899 Other long term (current) drug therapy: Secondary | ICD-10-CM | POA: Diagnosis not present

## 2017-08-02 DIAGNOSIS — Y9389 Activity, other specified: Secondary | ICD-10-CM | POA: Diagnosis not present

## 2017-08-02 DIAGNOSIS — Y999 Unspecified external cause status: Secondary | ICD-10-CM | POA: Diagnosis not present

## 2017-08-02 DIAGNOSIS — W208XXA Other cause of strike by thrown, projected or falling object, initial encounter: Secondary | ICD-10-CM | POA: Insufficient documentation

## 2017-08-02 DIAGNOSIS — Z7984 Long term (current) use of oral hypoglycemic drugs: Secondary | ICD-10-CM | POA: Diagnosis not present

## 2017-08-02 DIAGNOSIS — S99922A Unspecified injury of left foot, initial encounter: Secondary | ICD-10-CM | POA: Diagnosis present

## 2017-08-02 MED ORDER — AMOXICILLIN-POT CLAVULANATE 875-125 MG PO TABS: 1.0000 | ORAL_TABLET | Freq: Two times a day (BID) | ORAL | 0 refills | Status: DC

## 2017-08-02 NOTE — ED Triage Notes (Signed)
Patient presents to the ED with left foot pain x 2 weeks after patient dropped a large piece of wood on his foot.  Patient states, "my foot is so swollen I can't get my shoe on it."  Patient ambulatory to triage with limp.

## 2017-08-02 NOTE — Discharge Instructions (Addendum)
Your exam is consistent with a foot contusion and residual edema (swelling). There is no evidence of a serious skin infection. Continue to dose the Bactrim (Septra) as prescribed, you will also start in Augmentin, twice daily. It is important to rest with the foot elevated whenever you are seated. Apply ice packs to reduce swelling. Follow-up with Dr. Carlean Jews for wound check in 1 week. Return to the ED for signs of infection like fever, chills, nausea, or a hot, red foot.

## 2017-08-02 NOTE — ED Notes (Signed)
See triage note  States he had a tree fall onto left foot about 3 weeks ago  States pain was to lateral aspect of foot  Was seen and placed on Septra ds and had x-rays done  He is still having pain and swelling with redness to foot   States he was seen at [facility] and sent to ED

## 2017-08-02 NOTE — ED Provider Notes (Signed)
[facility] Emergency Department Provider Note ____________________________________________  Time seen: 1015  I have reviewed the triage vital signs and the nursing notes.  HISTORY  Chief Complaint  Foot Pain  HPI Francisco Reid is a 77 y.o. male presents himself to the ED from [facility], for evaluation of continued right foot pain, swelling, and redness.  Patient reports his initial injury occurred about 2 weeks prior.  He admits to having a large branch land across his foot, as he was cutting trees. He did not report for initial evaluation until 2 days prior, when he presented to [facility]. He was evaluated there with a negative x-ray. He was also started on Keflex and Bactrim. He was to return there today for wound check. There was apparently concern for cellulitis for his diabetic history. He also, reportedly suffered some itching from the Keflex. On eval today at [facility], he had the Keflex discontinued and was urged to report directly to the ED for evaluation of foot redness. The patient admits to being on his feet constantly because he is a widower. He denies any fevers, chills, sweats, skin temp or color changes, weeping, or purulent drainage. He notes old bruising now settled to the distal toes and sole perimeter. He does note tenderness and swelling making it hard to wear his shoes.   Past Medical History:  Diagnosis Date  . Cancer ([place])    melanoma  . Chronic sinus bradycardia   . Coronary artery disease   . Diabetes mellitus without complication ([place])   . History of dysphagia   . Hypercholesterolemia   . Hypertension   . Shortness of breath dyspnea   . Thyroid nodule     Patient Active Problem List   Diagnosis Date Noted  . 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 c-5 neck surgery    . COLON SURGERY    . COLONOSCOPY WITH ESOPHAGOGASTRODUODENOSCOPY (EGD)    . COLONOSCOPY WITH PROPOFOL N/A 07/12/2015   Procedure: COLONOSCOPY WITH PROPOFOL;  Surgeon: Lollie Sails, MD;  Location: [facility] ENDOSCOPY;  Service: Endoscopy;  Laterality: N/A;  . CORONARY ANGIOPLASTY WITH STENT PLACEMENT    . ESOPHAGOGASTRODUODENOSCOPY (EGD) WITH PROPOFOL N/A 07/12/2015   Procedure: ESOPHAGOGASTRODUODENOSCOPY (EGD) WITH PROPOFOL;  Surgeon: Lollie Sails, MD;  Location: [facility] ENDOSCOPY;  Service: Endoscopy;  Laterality: N/A;  . HERNIA REPAIR      Prior to Admission medications   Medication Sig Start Date End Date Taking? Authorizing Provider  clopidogrel (PLAVIX) 75 MG tablet Take 75 mg by mouth daily.   Yes [provider]  sulfamethoxazole-trimethoprim (BACTRIM DS,SEPTRA DS) 800-160 MG tablet Take 1 tablet by mouth 2 (two) times daily.   Yes [provider]  amoxicillin-clavulanate (AUGMENTIN) 875-125 MG tablet Take 1 tablet by mouth 2 (two) times daily. 08/02/17   Alejah Aristizabal, Dannielle Karvonen, PA-C  aspirin EC 81 MG tablet Take 81 mg by mouth daily.    [provider]  atorvastatin (LIPITOR) 40 MG tablet Take 1 tablet by mouth at bedtime.    [provider]  diphenhydrAMINE (BENADRYL) 25 mg capsule Take 2 capsules (50 mg total) by mouth every 6 (six) hours as needed. 12/07/15   Carrie Mew, MD  hydrALAZINE (APRESOLINE) 25 MG tablet Take 1 tablet (25 mg total) by mouth every 8 (eight) hours. 06/25/15   Epifanio Lesches, MD  metFORMIN (GLUMETZA) 500 MG (MOD) 24 hr tablet Take 500 mg by mouth every evening.  [provider]  predniSONE (DELTASONE) 20 MG tablet Take 1 tablet (20 mg total) by mouth 2 (two) times daily with a meal. 06/25/15   Epifanio Lesches, MD  tamsulosin (FLOMAX) 0.4 MG CAPS capsule Take 0.4 mg by mouth daily.    [provider]    Allergies Enalapril and Keflet [cephalexin]  No family history on file.  Social History Social History   Tobacco Use  . Smoking status: Former Smoker    Last attempt to quit: 09/01/1992    Years since  quitting: 24.9  . Smokeless tobacco: Never Used  Substance Use Topics  . Alcohol use: No  . Drug use: No    Review of Systems  Constitutional: Negative for fever, chills, or sweats. Cardiovascular: Negative for chest pain. Respiratory: Negative for shortness of breath. Gastrointestinal: Negative for abdominal pain, vomiting and diarrhea. Genitourinary: Negative for dysuria. Musculoskeletal: Negative for back pain. Left foot pain & swelling as above Skin: Negative for rash. Neurological: Negative for headaches, focal weakness or numbness. ____________________________________________  PHYSICAL EXAM:  VITAL SIGNS: ED Triage Vitals  Enc Vitals Group     BP 08/02/17 1004 124/71     Pulse Rate 08/02/17 1004 72     Resp 08/02/17 1004 16     Temp 08/02/17 1004 97.7 F (36.5 C)     Temp Source 08/02/17 1004 Oral     SpO2 08/02/17 1004 95 %     Weight 08/02/17 1005 205 lb (93 kg)     Height 08/02/17 1005 5\' 10"  (1.778 m)     Head Circumference --      Peak Flow --      Pain Score 08/02/17 1005 6     Pain Loc --      Pain Edu? --      Excl. in [place]? --     Constitutional: Alert and oriented. Well appearing and in no distress. Head: Normocephalic and atraumatic. Eyes: Conjunctivae are normal. Normal extraocular movements Cardiovascular: Normal rate, regular rhythm. Normal distal pulses and cap refill. Respiratory: Normal respiratory effort. No wheezes/rales/rhonchi. Musculoskeletal: Left foot without obvious deformity, dislocation, or joint effusion.  There is subtle soft tissue swelling to the dorsal aspect of the foot.  Patient also has old ecchymosis to the dorsal aspect of the distal toes.  Similar old bruising is noted to the medial lateral aspects of the sole.  Ankle exam is normal.  No calf or Achilles tenderness is noted proximally.  Nontender with normal range of motion in all extremities.  Neurologic: Antalgic gait without ataxia. Normal speech and language. No gross focal  neurologic deficits are appreciated. Skin:  Skin is warm, dry and intact. No rash, excoriations, desquamation, or weeping is noted.  Left foot is without any evidence of well-demarcated erythema, induration, or warmth.  No induration or purulence is appreciated.  Patient with a small scab to the dorsal foot without surrounding erythema, fluctuance, pointing, lymphangitis or drainage. ____________________________________________  INITIAL IMPRESSION / ASSESSMENT AND PLAN / ED COURSE  Geriatric patient with ED evaluation of a left foot contusion with soft tissue swelling.  Patient's exam is consistent with a 55-week old crush injury resulting in soft tissue swelling and bruising.  Review of the chart reveals x-rays taken 3 days prior were negative for any acute fracture dislocation.  Patient was started empirically on antibiotics for cellulitis.  His exam however is not concerning for erysipelas or nonpurulent cellulitis at this time.  Patient has discontinued Keflex related to possible allergic reaction.  He will continue with the Bactrim as previously prescribed.  I will add Augmentin to his regimen to give him coverage for MSSA.  Patient is advised that he must rest with the foot elevated to help reduce and resolve some of the soft tissue swelling.  He was reassured by his exam finding and the fact that he is not at risk for amputation to his foot based on his current presentation.  Patient's vital signs are stable in the ED and have been stable during his course according to the chart review and patient report.  He will be advised to follow-up with this ED or his primary care provider for ongoing symptom management.  Patient is discharged with a prescription as discussed and foot care instructions.  He verbalizes understanding and is reassured and thankful for the care given today. ____________________________________________  FINAL CLINICAL IMPRESSION(S) / ED DIAGNOSES  Final diagnoses:  Contusion of  left foot, initial encounter      Melvenia Needles, PA-C 08/02/17 1910    Arta Silence, MD 08/03/17 1103

## 2017-11-13 IMAGING — RF DG ESOPHAGUS
3 series · 14 of 21 positions shown · non-contrast
Comparison: None in PACs

CLINICAL DATA: Patient reports intermittent episodes of dysphagia
with food Giorgi food appearing to be stuck in the upper esophagus.
Relief becomes generally through regurgitate Giorgi the food; history
of esophageal dilation approximately 5 years ago.

EXAM:
ESOPHOGRAM / BARIUM SWALLOW / BARIUM TABLET STUDY
TECHNIQUE: Combined double contrast and single contrast examination performed
using effervescent crystals, thick barium liquid, and thin barium
liquid. The patient was observed with fluoroscopy swallowing a 13 mm
barium sulphate tablet.
FLUOROSCOPY TIME:  Fluoroscopy Time:  1 minutes, 6 seconds
Number of Acquired Images:  13+ 2 video clips

[Series 1: fluoro_barium 2fps_bw · 0.18mm/px · 9 of 13 slices shown (1 of 3)]
[im 1/13]
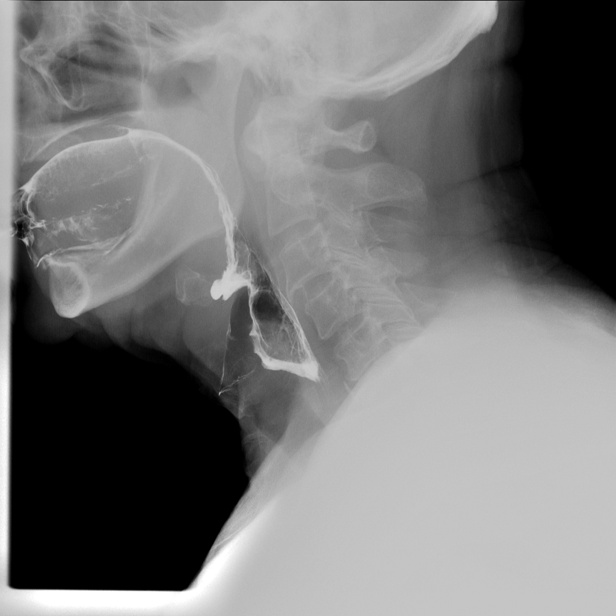
[im 3/13]
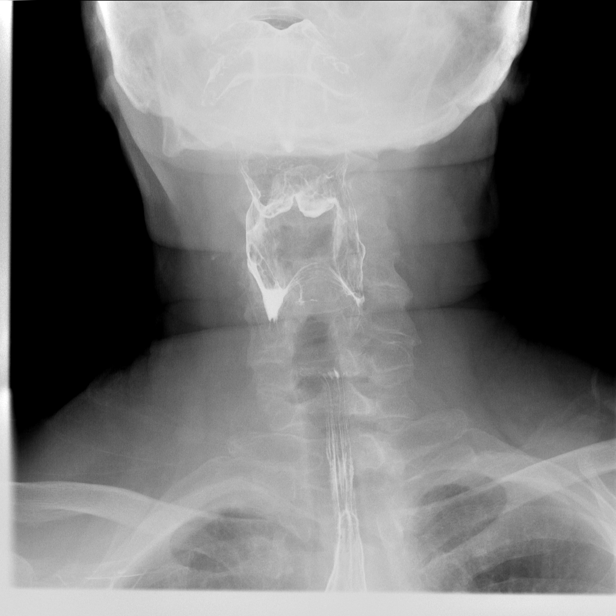
[im 4/13]
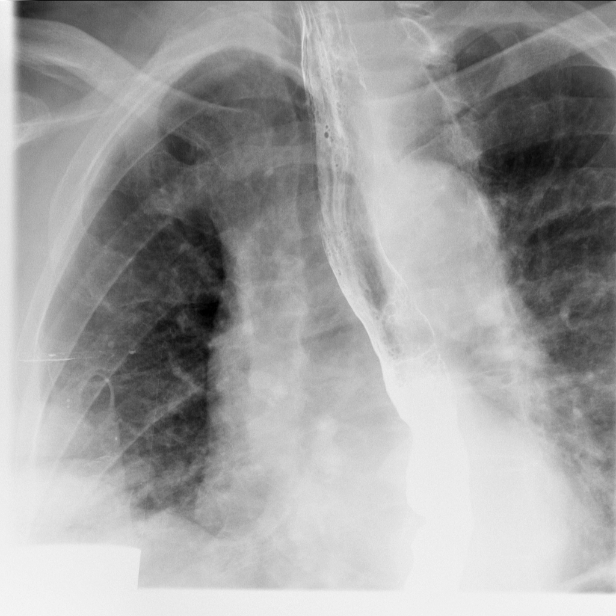
[im 6/13]
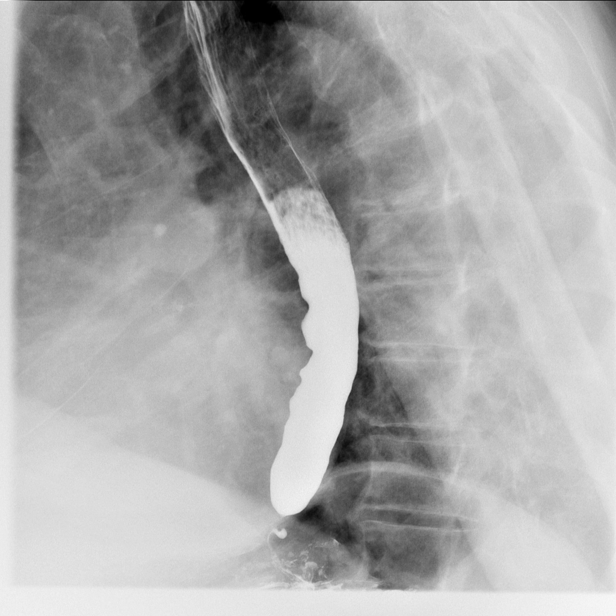
[im 7/13]
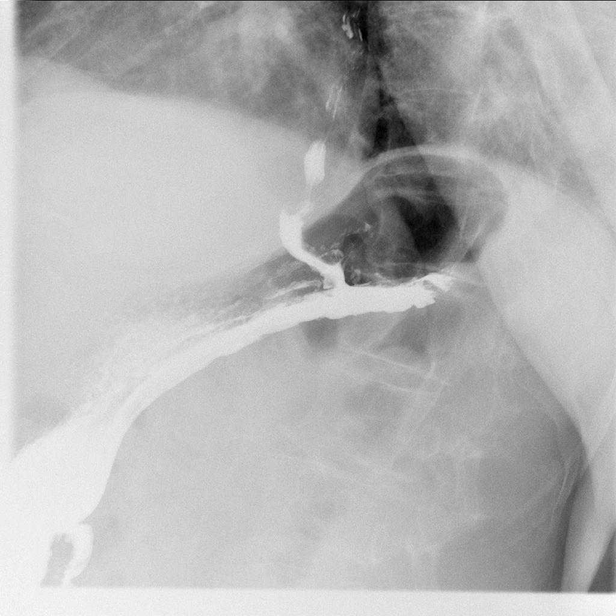
[im 9/13]
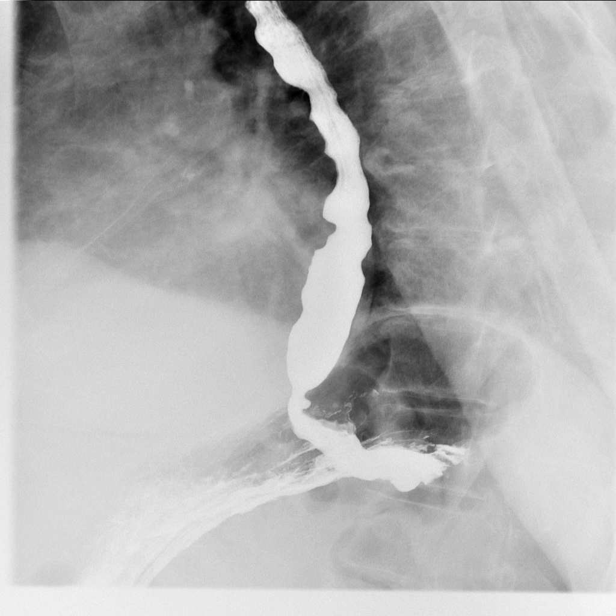
[im 10/13]
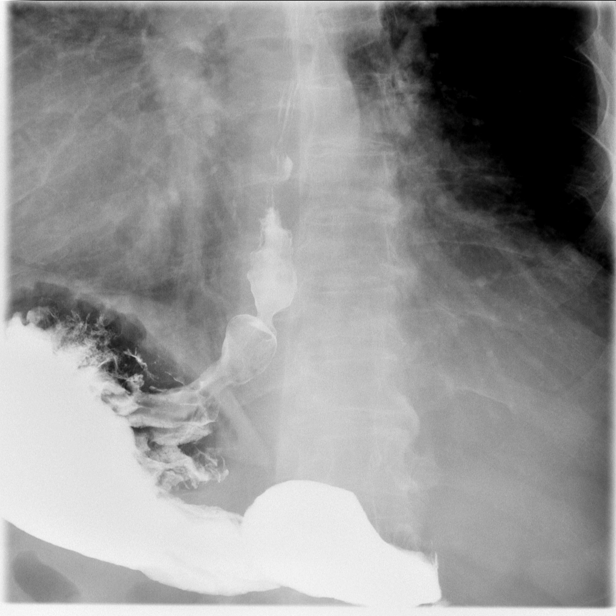
[im 12/13]
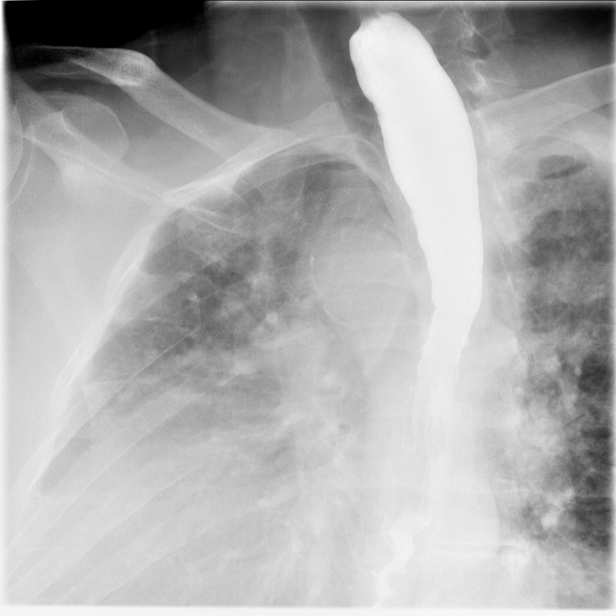
[im 13/13]
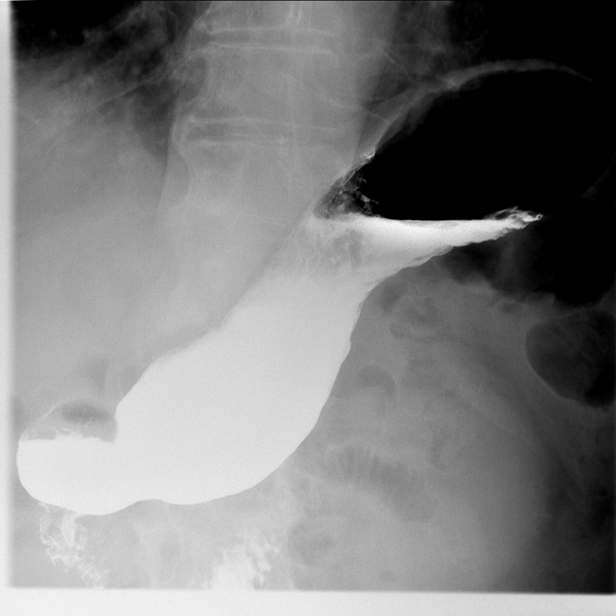

[Series 11: fluoro_barium 2fps_bw · 0.18mm/px · 2 of 19 frames shown (2 of 3)]
[frame 3/19]
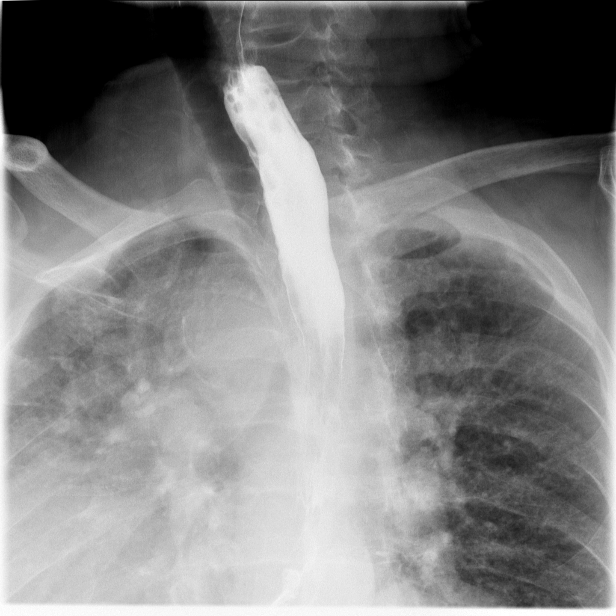
[frame 10/19]
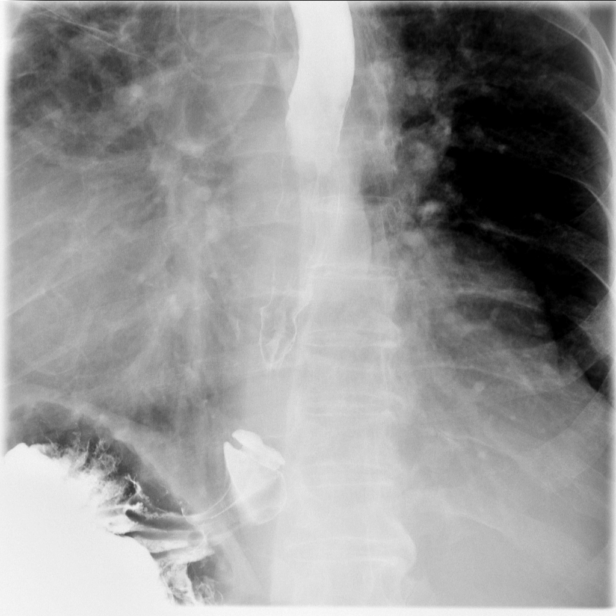

[Series 12: fluoro_barium 2fps_bw · 0.18mm/px · 3 of 18 frames shown (3 of 3)]
[frame 3/18]
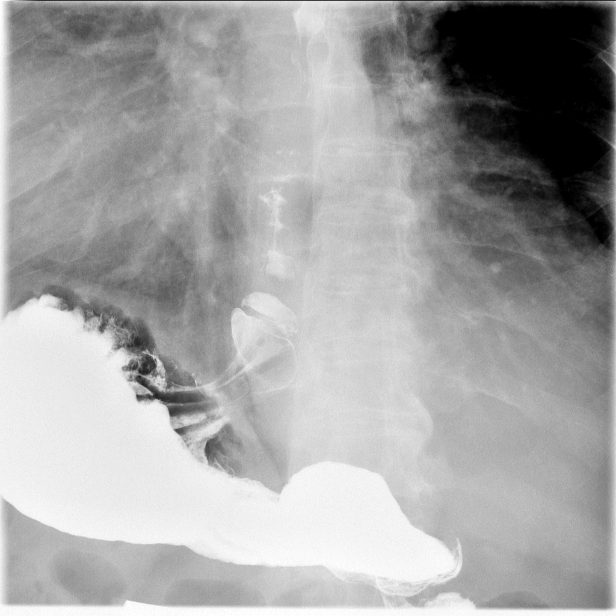
[frame 8/18]
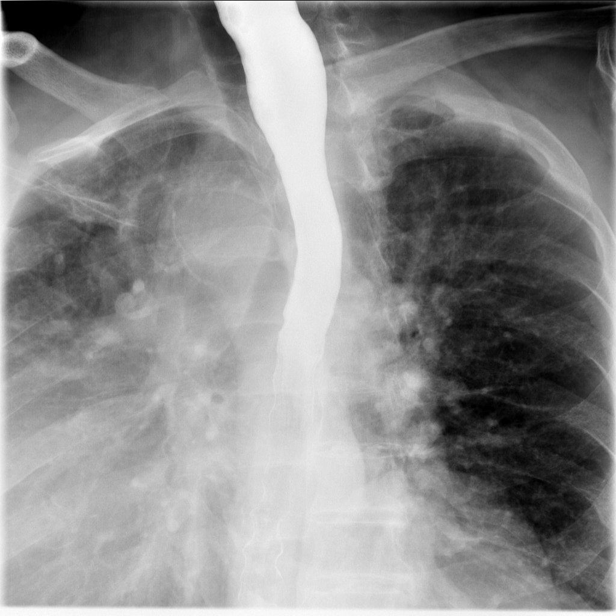
[frame 16/18]
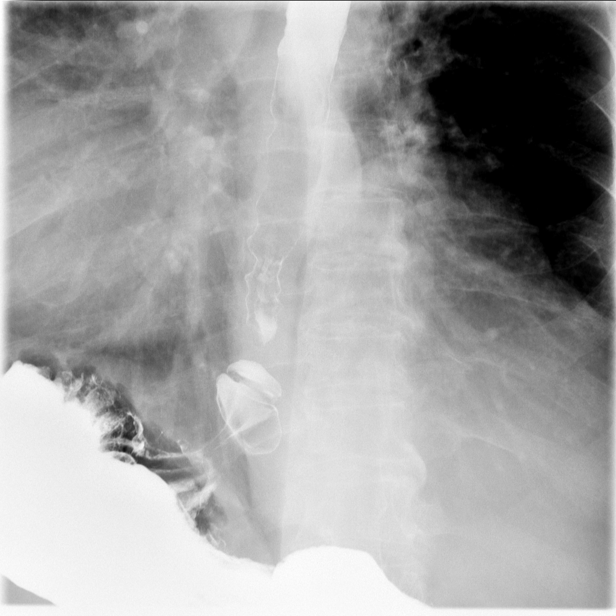

[14 of 21 positions shown; findings below may reference images not displayed]

FINDINGS: The cervical esophagus distended well. On the initial sip there was
a small amount of laryngeal penetration of the barium which reach
the upper aspect of the vocal cords. This did not recur. It did not
result in a cough reflex. The patient reports no episodes of a
choking or coughing paroxysms at home.

The thoracic esophagus distended reasonably well. There were
prominent intermittent tertiary contractions. There was no fixed
stricture. There was no evidence of esophagitis. There was a small
non reducible hiatal hernia. No reflux was observed. In the prone
position the esophageal motility was fairly weak. The barium tablet
passed promptly from the mouth to the stomach when the patient was
in the upright position.
IMPRESSION: 1. Transient laryngeal penetration of the barium on the initial sip.
This did not elicit a cough reflex.
2. Marked changes of presbyesophagus with prominent tertiary
contractions and fairly weak esophageal motility in the prone
position.
3. Small non reducible hiatal hernia. No evidence of a stricture nor
of esophagitis.

## 2018-02-24 ENCOUNTER — Other Ambulatory Visit: Payer: Self-pay

## 2018-02-24 ENCOUNTER — Emergency Department: Payer: Medicare Other

## 2018-02-24 ENCOUNTER — Encounter: Payer: Self-pay | Admitting: Radiology

## 2018-02-24 ENCOUNTER — Emergency Department
Admission: EM | Admit: 2018-02-24 | Discharge: 2018-02-24 | Disposition: A | Discharge status: Home / Self Care | Payer: Medicare Other | Attending: Emergency Medicine | Admitting: Emergency Medicine

## 2018-02-24 DIAGNOSIS — Z79899 Other long term (current) drug therapy: Secondary | ICD-10-CM | POA: Insufficient documentation

## 2018-02-24 DIAGNOSIS — Z7982 Long term (current) use of aspirin: Secondary | ICD-10-CM | POA: Insufficient documentation

## 2018-02-24 DIAGNOSIS — E119 Type 2 diabetes mellitus without complications: Secondary | ICD-10-CM | POA: Diagnosis not present

## 2018-02-24 DIAGNOSIS — M545 Low back pain, unspecified: Secondary | ICD-10-CM

## 2018-02-24 DIAGNOSIS — I10 Essential (primary) hypertension: Secondary | ICD-10-CM | POA: Insufficient documentation

## 2018-02-24 DIAGNOSIS — R1084 Generalized abdominal pain: Secondary | ICD-10-CM | POA: Insufficient documentation

## 2018-02-24 DIAGNOSIS — Z87891 Personal history of nicotine dependence: Secondary | ICD-10-CM | POA: Diagnosis not present

## 2018-02-24 DIAGNOSIS — Z7984 Long term (current) use of oral hypoglycemic drugs: Secondary | ICD-10-CM | POA: Diagnosis not present

## 2018-02-24 LAB — URINALYSIS, COMPLETE (UACMP) WITH MICROSCOPIC
BACTERIA UA: NONE SEEN
BILIRUBIN URINE: NEGATIVE
Glucose, UA: 50 mg/dL — AB
Hgb urine dipstick: NEGATIVE
Ketones, ur: NEGATIVE mg/dL
LEUKOCYTES UA: NEGATIVE
Nitrite: NEGATIVE
PROTEIN: NEGATIVE mg/dL
SQUAMOUS EPITHELIAL / LPF: NONE SEEN (ref 0–5)
Specific Gravity, Urine: 1.021 (ref 1.005–1.030)
pH: 5 (ref 5.0–8.0)

## 2018-02-24 LAB — COMPREHENSIVE METABOLIC PANEL
ALBUMIN: 4.1 g/dL (ref 3.5–5.0)
ALT: 46 U/L — ABNORMAL HIGH (ref 0–44)
ANION GAP: 7 (ref 5–15)
AST: 31 U/L (ref 15–41)
Alkaline Phosphatase: 74 U/L (ref 38–126)
BUN: 13 mg/dL (ref 8–23)
CHLORIDE: 106 mmol/L (ref 98–111)
CO2: 26 mmol/L (ref 22–32)
Calcium: 9.1 mg/dL (ref 8.9–10.3)
Creatinine, Ser: 0.79 mg/dL (ref 0.61–1.24)
GFR calc Af Amer: >60 mL/min (ref >60)
GFR calc non Af Amer: >60 mL/min (ref >60)
GLUCOSE: 152 mg/dL — AB (ref 70–99)
POTASSIUM: 3.9 mmol/L (ref 3.5–5.1)
SODIUM: 139 mmol/L (ref 135–145)
Total Bilirubin: 0.9 mg/dL (ref 0.3–1.2)
Total Protein: 7.1 g/dL (ref 6.5–8.1)

## 2018-02-24 LAB — CBC
HCT: 46.5 % (ref 39.0–52.0)
Hemoglobin: 15.7 g/dL (ref 13.0–17.0)
MCH: 29.5 pg (ref 26.0–34.0)
MCHC: 33.8 g/dL (ref 30.0–36.0)
MCV: 87.4 fL (ref 80.0–100.0)
NRBC: 0 % (ref 0.0–0.2)
Platelets: 137 10*3/uL — ABNORMAL LOW (ref 150–400)
RBC: 5.32 MIL/uL (ref 4.22–5.81)
RDW: 13 % (ref 11.5–15.5)
WBC: 7.3 10*3/uL (ref 4.0–10.5)

## 2018-02-24 MED ORDER — LIDOCAINE 5 % EX PTCH: 1.0000 | MEDICATED_PATCH | Freq: Two times a day (BID) | CUTANEOUS | 0 refills | Status: AC

## 2018-02-24 MED ORDER — ACETAMINOPHEN 10 MG/ML IV SOLN
1000.0000 mg | INTRAVENOUS | Status: AC
  Administered 2018-02-24: 1000 mg via INTRAVENOUS
  Filled 2018-02-24: qty 100

## 2018-02-24 MED ORDER — LIDOCAINE 5 % EX PTCH
1.0000 | MEDICATED_PATCH | CUTANEOUS | Status: DC
  Administered 2018-02-24: 1 via TRANSDERMAL
  Filled 2018-02-24: qty 1

## 2018-02-24 MED ORDER — IOHEXOL 350 MG/ML SOLN
100.0000 mL | Freq: Once | INTRAVENOUS | Status: AC | PRN
  Administered 2018-02-24: 100 mL via INTRAVENOUS

## 2018-02-24 NOTE — ED Provider Notes (Signed)
[facility] Emergency Department Provider Note  ____________________________________________  Time seen: Approximately 6:36 AM  I have reviewed the triage vital signs and the nursing notes.   HISTORY  Chief Complaint Flank Pain    HPI ELDRIGE PITKIN is a 78 y.o. male with a history of diabetes hypertension and CAD and kidney stones who complains of right flank pain for the past 2 weeks.  Contrary to the triage note, he describes it to me as being in the right lower back, nonradiating, without aggravating or alleviating factors.  Continuous and waxing and waning.  No dysuria frequency urgency.  No vomiting diarrhea constipation fevers or chills.   Denies radiation into the lower extremities.  No bowel or bladder retention or incontinence.  No paresthesias weakness or numbness in the legs or groin.   Past Medical History:  Diagnosis Date  . Cancer ([place])    melanoma  . Chronic sinus bradycardia   . Coronary artery disease   . Diabetes mellitus without complication ([place])   . History of dysphagia   . Hypercholesterolemia   . Hypertension   . Shortness of breath dyspnea   . Thyroid nodule      Patient Active Problem List   Diagnosis Date Noted  . Angioedema 06/25/2015     Past Surgical History:  Procedure Laterality Date  . c-5 neck surgery    . COLON SURGERY    . COLONOSCOPY WITH ESOPHAGOGASTRODUODENOSCOPY (EGD)    . COLONOSCOPY WITH PROPOFOL N/A 07/12/2015   Procedure: COLONOSCOPY WITH PROPOFOL;  Surgeon: Lollie Sails, MD;  Location: [facility] ENDOSCOPY;  Service: Endoscopy;  Laterality: N/A;  . CORONARY ANGIOPLASTY WITH STENT PLACEMENT    . ESOPHAGOGASTRODUODENOSCOPY (EGD) WITH PROPOFOL N/A 07/12/2015   Procedure: ESOPHAGOGASTRODUODENOSCOPY (EGD) WITH PROPOFOL;  Surgeon: Lollie Sails, MD;  Location: [facility] ENDOSCOPY;  Service: Endoscopy;  Laterality: N/A;  . HERNIA REPAIR       Prior to Admission medications   Medication Sig Start Date End  Date Taking? Authorizing Provider  aspirin EC 81 MG tablet Take 81 mg by mouth daily.   Yes [provider]  atorvastatin (LIPITOR) 40 MG tablet Take 1 tablet by mouth at bedtime.   Yes [provider]  hydrochlorothiazide (HYDRODIURIL) 12.5 MG tablet Take 12.5 mg by mouth daily. 12/11/17  Yes [provider]  metFORMIN (GLUCOPHAGE) 1000 MG tablet Take 1,000 mg by mouth 2 (two) times daily with a meal. 12/11/17  Yes [provider]  tamsulosin (FLOMAX) 0.4 MG CAPS capsule Take 0.4 mg by mouth daily.   Yes [provider]  amoxicillin-clavulanate (AUGMENTIN) 875-125 MG tablet Take 1 tablet by mouth 2 (two) times daily. Patient not taking: Reported on 02/24/2018 08/02/17   Menshew, Dannielle Karvonen, PA-C  clopidogrel (PLAVIX) 75 MG tablet Take 75 mg by mouth daily.    [provider]  diphenhydrAMINE (BENADRYL) 25 mg capsule Take 2 capsules (50 mg total) by mouth every 6 (six) hours as needed. Patient not taking: Reported on 02/24/2018 12/07/15   Carrie Mew, MD  hydrALAZINE (APRESOLINE) 25 MG tablet Take 1 tablet (25 mg total) by mouth every 8 (eight) hours. Patient not taking: Reported on 02/24/2018 06/25/15   Epifanio Lesches, MD  lidocaine (LIDODERM) 5 % Place 1 patch onto the skin every 12 (twelve) hours. Remove & Discard patch within 12 hours or as directed by MD 02/24/18 02/24/19  Merlyn Lot, MD  metFORMIN (GLUMETZA) 500 MG (MOD) 24 hr tablet Take 500 mg by mouth  every evening.     [provider]  predniSONE (DELTASONE) 20 MG tablet Take 1 tablet (20 mg total) by mouth 2 (two) times daily with a meal. Patient not taking: Reported on 02/24/2018 06/25/15   Epifanio Lesches, MD  sulfamethoxazole-trimethoprim (BACTRIM DS,SEPTRA DS) 800-160 MG tablet Take 1 tablet by mouth 2 (two) times daily.    [provider]     Allergies Enalapril and Keflet [cephalexin]   No family history on file.  Social  History Social History   Tobacco Use  . Smoking status: Former Smoker    Last attempt to quit: 09/01/1992    Years since quitting: 25.5  . Smokeless tobacco: Never Used  Substance Use Topics  . Alcohol use: No  . Drug use: No    Review of Systems  Constitutional:   No fever or chills.  ENT:   No sore throat. No rhinorrhea. Cardiovascular:   No chest pain or syncope. Respiratory:   No dyspnea or cough. Gastrointestinal:   Negative for abdominal pain, vomiting and diarrhea.  Musculoskeletal:   Positive right flank pain as above. All other systems reviewed and are negative except as documented above in ROS and HPI.  ____________________________________________   PHYSICAL EXAM:  VITAL SIGNS: ED Triage Vitals [02/24/18 0621]  Enc Vitals Group     BP (!) 172/86     Pulse Rate (!) 56     Resp 18     Temp 97.8 F (36.6 C)     Temp Source Oral     SpO2 96 %     Weight 205 lb (93 kg)     Height 5\' 10"  (1.778 m)     Head Circumference      Peak Flow      Pain Score 9     Pain Loc      Pain Edu?      Excl. in [place]?     Vital signs reviewed, nursing assessments reviewed.   Constitutional:   Alert and oriented. Non-toxic appearance. Eyes:   Conjunctivae are normal. EOMI. PERRL. ENT      Head:   Normocephalic and atraumatic.      Nose:   No congestion/rhinnorhea.       Mouth/Throat:   MMM, no pharyngeal erythema. No peritonsillar mass.       Neck:   No meningismus. Full ROM. Hematological/Lymphatic/Immunilogical:   No cervical lymphadenopathy. Cardiovascular:   RRR. Symmetric bilateral radial and DP pulses.  No murmurs. Cap refill less than 2 seconds. Respiratory:   Normal respiratory effort without tachypnea/retractions. Breath sounds are clear and equal bilaterally. No wheezes/rales/rhonchi. Gastrointestinal:   Soft and nontender. Non distended. There is no CVA tenderness.  No rebound, rigidity, or guarding. Musculoskeletal:   Normal range of motion in all extremities. No  joint effusions.  No lower extremity tenderness.  No edema. Neurologic:   Normal speech and language.  Motor grossly intact. No acute focal neurologic deficits are appreciated.  Skin:    Skin is warm, dry and intact. No rash noted.  No petechiae, purpura, or bullae.  ____________________________________________    LABS (pertinent positives/negatives) (all labs ordered are listed, but only abnormal results are displayed) Labs Reviewed  URINALYSIS, COMPLETE (UACMP) WITH MICROSCOPIC - Abnormal; Notable for the following components:      Result Value   Color, Urine YELLOW (*)    APPearance CLEAR (*)    Glucose, UA 50 (*)    All other components within normal limits  CBC - Abnormal;  Notable for the following components:   Platelets 137 (*)    All other components within normal limits  COMPREHENSIVE METABOLIC PANEL - Abnormal; Notable for the following components:   Glucose, Bld 152 (*)    ALT 46 (*)    All other components within normal limits   ____________________________________________   EKG    ____________________________________________    RADIOLOGY  No results found.  ____________________________________________   PROCEDURES Procedures  ____________________________________________  DIFFERENTIAL DIAGNOSIS   Ureterolithiasis, appendicitis, diverticulitis, AAA, dissection  CLINICAL IMPRESSION / ASSESSMENT AND PLAN / ED COURSE  Pertinent labs & imaging results that were available during my care of the patient were reviewed by me and considered in my medical decision making (see chart for details).    Patient presents with right flank pain.  Exam unremarkable.  Vitals do show elevated blood pressure.  Due to age and comorbidities, NSAIDs are not appropriate for pain control.  Would like to avoid opioids in this elderly man so I will order him IV Tylenol for now.  CT without contrast ordered for expeditious imaging and to confirm presumed diagnosis of  ureterolithiasis.  We will also obtain labs.  If CT negative, may need to proceed with a CT angiogram.  Per review of electronic medical records, baseline kidney function is normal.    ----------------------------------------- 3:25 PM on 02/27/2018 ----------------------------------------- Care of patient was signed out to Dr. Quentin Cornwall at 7:00 AM on February 24, 2018 with CT angiogram pending..      ____________________________________________   FINAL CLINICAL IMPRESSION(S) / ED DIAGNOSES    Final diagnoses:  Acute right-sided low back pain without sciatica     ED Discharge Orders         Ordered    lidocaine (LIDODERM) 5 %  Every 12 hours     02/24/18 0859          Portions of this note were generated with dragon dictation software. Dictation errors may occur despite best attempts at proofreading.   Carrie Mew, MD 02/27/18 [phone]

## 2018-02-24 NOTE — ED Triage Notes (Signed)
Pt states two weeks of right flank pain that is radiating down both legs. Pt has nausea, denies known hematuria. Pt denies difficulty urinating.

## 2018-02-24 NOTE — ED Provider Notes (Signed)
CTA is reassuring.  No evidence of AAA.  Pain seems to be primarily musculoskeletal in nature.  Will give Lidoderm patch and discussed signs and symptoms for which the patient should follow-up with PCP and signs and symptoms which the patient should return to the ER.   Merlyn Lot, MD 02/24/18 [phone]

## 2019-06-05 ENCOUNTER — Ambulatory Visit: Payer: Medicare (Managed Care) | Admitting: Urology

## 2019-06-05 ENCOUNTER — Encounter: Payer: Self-pay | Admitting: Urology

## 2019-07-02 ENCOUNTER — Encounter (HOSPITAL_COMMUNITY): Payer: Self-pay | Admitting: Emergency Medicine

## 2019-07-02 ENCOUNTER — Encounter: Payer: Self-pay | Admitting: Emergency Medicine

## 2019-07-02 ENCOUNTER — Other Ambulatory Visit: Payer: Self-pay

## 2019-07-02 ENCOUNTER — Emergency Department
Admission: EM | Admit: 2019-07-02 | Discharge: 2019-07-02 | Disposition: A | Discharge status: Home / Self Care | Payer: Medicare (Managed Care) | Attending: Emergency Medicine | Admitting: Emergency Medicine

## 2019-07-02 ENCOUNTER — Emergency Department (HOSPITAL_COMMUNITY)
Admission: EM | Admit: 2019-07-02 | Discharge: 2019-07-02 | Disposition: A | Discharge status: Home / Self Care | Payer: Medicare (Managed Care) | Attending: Emergency Medicine | Admitting: Emergency Medicine

## 2019-07-02 DIAGNOSIS — X19XXXD Contact with other heat and hot substances, subsequent encounter: Secondary | ICD-10-CM | POA: Diagnosis not present

## 2019-07-02 DIAGNOSIS — Z5321 Procedure and treatment not carried out due to patient leaving prior to being seen by health care provider: Secondary | ICD-10-CM | POA: Insufficient documentation

## 2019-07-02 DIAGNOSIS — T300 Burn of unspecified body region, unspecified degree: Secondary | ICD-10-CM | POA: Diagnosis not present

## 2019-07-02 DIAGNOSIS — T22011D Burn of unspecified degree of right forearm, subsequent encounter: Secondary | ICD-10-CM | POA: Diagnosis present

## 2019-07-02 LAB — CBC WITH DIFFERENTIAL/PLATELET
Abs Immature Granulocytes: 0.03 10*3/uL (ref 0.00–0.07)
Basophils Absolute: 0 10*3/uL (ref 0.0–0.1)
Basophils Relative: 0 %
Eosinophils Absolute: 0.1 10*3/uL (ref 0.0–0.5)
Eosinophils Relative: 1 %
HCT: 47.6 % (ref 39.0–52.0)
Hemoglobin: 15.8 g/dL (ref 13.0–17.0)
Immature Granulocytes: 0 %
Lymphocytes Relative: 24 %
Lymphs Abs: 2.3 10*3/uL (ref 0.7–4.0)
MCH: 30 pg (ref 26.0–34.0)
MCHC: 33.2 g/dL (ref 30.0–36.0)
MCV: 90.5 fL (ref 80.0–100.0)
Monocytes Absolute: 0.9 10*3/uL (ref 0.1–1.0)
Monocytes Relative: 10 %
Neutro Abs: 6 10*3/uL (ref 1.7–7.7)
Neutrophils Relative %: 65 %
Platelets: 149 10*3/uL — ABNORMAL LOW (ref 150–400)
RBC: 5.26 MIL/uL (ref 4.22–5.81)
RDW: 12.9 % (ref 11.5–15.5)
WBC: 9.4 10*3/uL (ref 4.0–10.5)
nRBC: 0 % (ref 0.0–0.2)

## 2019-07-02 LAB — COMPREHENSIVE METABOLIC PANEL
ALT: 35 U/L (ref 0–44)
AST: 27 U/L (ref 15–41)
Albumin: 4.2 g/dL (ref 3.5–5.0)
Alkaline Phosphatase: 105 U/L (ref 38–126)
Anion gap: 8 (ref 5–15)
BUN: 14 mg/dL (ref 8–23)
CO2: 27 mmol/L (ref 22–32)
Calcium: 9.3 mg/dL (ref 8.9–10.3)
Chloride: 104 mmol/L (ref 98–111)
Creatinine, Ser: 1.08 mg/dL (ref 0.61–1.24)
GFR calc Af Amer: >60 mL/min (ref >60)
GFR calc non Af Amer: >60 mL/min (ref >60)
Glucose, Bld: 257 mg/dL — ABNORMAL HIGH (ref 70–99)
Potassium: 4.4 mmol/L (ref 3.5–5.1)
Sodium: 139 mmol/L (ref 135–145)
Total Bilirubin: 0.9 mg/dL (ref 0.3–1.2)
Total Protein: 7.3 g/dL (ref 6.5–8.1)

## 2019-07-02 LAB — LACTIC ACID, PLASMA: Lactic Acid, Venous: 2.4 mmol/L (ref 0.5–1.9)
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
## 2019-07-02 NOTE — ED Notes (Addendum)
Pt left and reported he would come back tomorrow. DR Underwood AWARE OF LAB RESULTS.
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
## 2019-07-07 LAB — CULTURE, BLOOD (ROUTINE X 2): Culture: NO GROWTH

## 2019-07-15 ENCOUNTER — Ambulatory Visit: Payer: Medicare (Managed Care) | Admitting: Urology

## 2019-07-15 ENCOUNTER — Encounter: Payer: Self-pay | Admitting: Urology

## 2019-07-15 NOTE — Progress Notes (Incomplete)
06/30/19 2:28 PM   Francisco Reid 02-27-1940 [id number]  Referring provider: Dion Body, MD [place] [facility] [place],  [place] [address] No chief complaint on file.   HPI: Francisco Reid is a 79 y.o. male who presents for dysuria and BPH.  -    PMH: Past Medical History:  Diagnosis Date  . Cancer ([place])    melanoma  . Chronic sinus bradycardia   . Coronary artery disease   . Diabetes mellitus without complication ([place])   . History of dysphagia   . Hypercholesterolemia   . Hypertension   . Shortness of breath dyspnea   . Thyroid nodule     Surgical History: Past Surgical History:  Procedure Laterality Date  . c-5 neck surgery    . COLON SURGERY    . COLONOSCOPY WITH ESOPHAGOGASTRODUODENOSCOPY (EGD)    . COLONOSCOPY WITH PROPOFOL N/A 07/12/2015   Procedure: COLONOSCOPY WITH PROPOFOL;  Surgeon: Francisco Sails, MD;  Location: [facility] ENDOSCOPY;  Service: Endoscopy;  Laterality: N/A;  . CORONARY ANGIOPLASTY WITH STENT PLACEMENT    . ESOPHAGOGASTRODUODENOSCOPY (EGD) WITH PROPOFOL N/A 07/12/2015   Procedure: ESOPHAGOGASTRODUODENOSCOPY (EGD) WITH PROPOFOL;  Surgeon: Francisco Sails, MD;  Location: [facility] ENDOSCOPY;  Service: Endoscopy;  Laterality: N/A;  . HERNIA REPAIR      Home Medications:  Allergies as of 07/15/2019      Reactions   Enalapril    Keflet [cephalexin] Rash      Medication List       Accurate as of July 15, 2019  2:28 PM. If you have any questions, ask your nurse or doctor.        amoxicillin-clavulanate 875-125 MG tablet Commonly known as: Augmentin Take 1 tablet by mouth 2 (two) times daily.   aspirin EC 81 MG tablet Take 81 mg by mouth daily.   atorvastatin 40 MG tablet Commonly known as: LIPITOR Take 1 tablet by mouth at bedtime.   clopidogrel 75 MG tablet Commonly known as: PLAVIX Take 75 mg by mouth daily.   diphenhydrAMINE 25 mg capsule Commonly known as: BENADRYL Take 2 capsules (50 mg total) by  mouth every 6 (six) hours as needed.   hydrALAZINE 25 MG tablet Commonly known as: APRESOLINE Take 1 tablet (25 mg total) by mouth every 8 (eight) hours.   hydrochlorothiazide 12.5 MG tablet Commonly known as: HYDRODIURIL Take 12.5 mg by mouth daily.   metFORMIN 500 MG (MOD) 24 hr tablet Commonly known as: GLUMETZA Take 500 mg by mouth every evening.   metFORMIN 1000 MG tablet Commonly known as: GLUCOPHAGE Take 1,000 mg by mouth 2 (two) times daily with a meal.   predniSONE 20 MG tablet Commonly known as: Deltasone Take 1 tablet (20 mg total) by mouth 2 (two) times daily with a meal.   sulfamethoxazole-trimethoprim 800-160 MG tablet Commonly known as: BACTRIM DS Take 1 tablet by mouth 2 (two) times daily.   tamsulosin 0.4 MG Caps capsule Commonly known as: FLOMAX Take 0.4 mg by mouth daily.       Allergies:  Allergies  Allergen Reactions  . Enalapril   . Keflet [Cephalexin] Rash    Family History: No family history on file.  Social History:  reports that he quit smoking about 26 years ago. He has never used smokeless tobacco. He reports that he does not drink alcohol and does not use drugs.   Physical Exam: There were no vitals taken for this visit.  Constitutional:  Alert and oriented, No  acute distress. HEENT: [place] AT, moist mucus membranes.  Trachea midline, no masses. Cardiovascular: No clubbing, cyanosis, or edema. Respiratory: Normal respiratory effort, no increased work of breathing. GI: Abdomen is soft, nontender, nondistended, no abdominal masses GU: No CVA tenderness Lymph: No cervical or inguinal lymphadenopathy. Skin: No rashes, bruises or suspicious lesions. Neurologic: Grossly intact, no focal deficits, moving all 4 extremities. Psychiatric: Normal mood and affect.  Laboratory Data:  Lab Results  Component Value Date   CREATININE 1.08 07/02/2019    No results found for: PSA  No results found for: TESTOSTERONE  No results found for:  HGBA1C  Urinalysis   Pertinent Imaging: *** No results found for this or any previous visit.  No results found for this or any previous visit.  No results found for this or any previous visit.  No results found for this or any previous visit.  No results found for this or any previous visit.  No results found for this or any previous visit.  No results found for this or any previous visit.  Results for orders placed during the hospital encounter of 02/24/18  CT Renal Stone Study  Narrative CLINICAL DATA:  Flank pain with recurrent stone disease suspected. Two weeks of right flank pain radiating to both legs.  EXAM: CT ABDOMEN AND PELVIS WITHOUT CONTRAST  TECHNIQUE: Multidetector CT imaging of the abdomen and pelvis was performed following the standard protocol without IV contrast.  COMPARISON:  11/28/2009  FINDINGS: Lower chest:  No acute finding  Hepatobiliary: Borderline hepatic steatosis with questionable sparing at the gallbladder fossa, improved from prior.No evidence of biliary obstruction or stone.  Pancreas: Unremarkable.  Spleen: Unremarkable.  Adrenals/Urinary Tract: Negative adrenals. No hydronephrosis or stone. Unremarkable bladder.  Stomach/Bowel: No obstruction. No appendicitis. Formed stool throughout the colon. Moderate colonic diverticulosis.  Vascular/Lymphatic: No acute vascular abnormality. Diffuse atherosclerotic calcification. No mass or adenopathy.  Reproductive:No pathologic findings.  Other: No ascites or pneumoperitoneum.  Musculoskeletal: No acute abnormalities. Usual degenerative changes without evident high-grade canal impingement.  IMPRESSION: No acute finding or explanation for abdominal pain. No new finding when compared to 2011 abdominal CT.   Electronically Signed By: Francisco Reid M.D. On: 02/24/2018 06:59   Assessment & Plan:     No follow-ups on file.  [facility] [address] [place], [place] [address] [phone]

## 2020-01-27 ENCOUNTER — Encounter: Payer: Self-pay | Admitting: Emergency Medicine

## 2020-01-27 ENCOUNTER — Other Ambulatory Visit: Payer: Self-pay

## 2020-01-27 ENCOUNTER — Emergency Department
Admission: EM | Admit: 2020-01-27 | Discharge: 2020-01-27 | Disposition: A | Discharge status: Home / Self Care | Payer: Medicare (Managed Care) | Attending: Emergency Medicine | Admitting: Emergency Medicine

## 2020-01-27 DIAGNOSIS — H6002 Abscess of left external ear: Secondary | ICD-10-CM | POA: Insufficient documentation

## 2020-01-27 DIAGNOSIS — Z5321 Procedure and treatment not carried out due to patient leaving prior to being seen by health care provider: Secondary | ICD-10-CM | POA: Insufficient documentation

## 2020-01-27 NOTE — ED Triage Notes (Signed)
Pt called to room, no response. 

## 2020-01-27 NOTE — ED Notes (Signed)
Pt called x's 3, no response ?

## 2020-01-27 NOTE — ED Triage Notes (Signed)
Pt reports has had a bump on his left ear for a long time but over the last bit the area has gotten bigger and more painful.

## 2021-01-04 ENCOUNTER — Encounter (HOSPITAL_COMMUNITY): Payer: Self-pay

## 2021-01-04 ENCOUNTER — Emergency Department (HOSPITAL_COMMUNITY): Payer: Medicare Other

## 2021-01-04 ENCOUNTER — Emergency Department (HOSPITAL_COMMUNITY)
Admission: EM | Admit: 2021-01-04 | Discharge: 2021-01-04 | Disposition: A | Discharge status: Home / Self Care | Payer: Medicare Other | Attending: Emergency Medicine | Admitting: Emergency Medicine

## 2021-01-04 ENCOUNTER — Other Ambulatory Visit: Payer: Self-pay

## 2021-01-04 DIAGNOSIS — Z87891 Personal history of nicotine dependence: Secondary | ICD-10-CM | POA: Insufficient documentation

## 2021-01-04 DIAGNOSIS — I119 Hypertensive heart disease without heart failure: Secondary | ICD-10-CM | POA: Diagnosis not present

## 2021-01-04 DIAGNOSIS — Z7982 Long term (current) use of aspirin: Secondary | ICD-10-CM | POA: Diagnosis not present

## 2021-01-04 DIAGNOSIS — Z79899 Other long term (current) drug therapy: Secondary | ICD-10-CM | POA: Diagnosis not present

## 2021-01-04 DIAGNOSIS — M25561 Pain in right knee: Secondary | ICD-10-CM | POA: Diagnosis present

## 2021-01-04 DIAGNOSIS — E119 Type 2 diabetes mellitus without complications: Secondary | ICD-10-CM | POA: Diagnosis not present

## 2021-01-04 DIAGNOSIS — Z5321 Procedure and treatment not carried out due to patient leaving prior to being seen by health care provider: Secondary | ICD-10-CM | POA: Insufficient documentation

## 2021-01-04 DIAGNOSIS — I2510 Atherosclerotic heart disease of native coronary artery without angina pectoris: Secondary | ICD-10-CM | POA: Diagnosis not present

## 2021-01-04 DIAGNOSIS — Z7984 Long term (current) use of oral hypoglycemic drugs: Secondary | ICD-10-CM | POA: Diagnosis not present

## 2021-01-04 MED ORDER — IBUPROFEN 800 MG PO TABS: 800.0000 mg | ORAL_TABLET | Freq: Three times a day (TID) | ORAL | 0 refills | Status: DC | PRN
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
## 2021-01-04 NOTE — Discharge Instructions (Signed)
Follow-up with your family doctor next week if any problems.  Clean abrasions twice a day with soap and water

## 2021-08-18 ENCOUNTER — Emergency Department: Payer: Medicare Other

## 2021-08-18 ENCOUNTER — Emergency Department
Admission: EM | Admit: 2021-08-18 | Discharge: 2021-08-18 | Disposition: A | Discharge status: Home / Self Care | Payer: Medicare Other | Attending: Emergency Medicine | Admitting: Emergency Medicine

## 2021-08-18 ENCOUNTER — Other Ambulatory Visit: Payer: Self-pay

## 2021-08-18 ENCOUNTER — Encounter: Payer: Self-pay | Admitting: Emergency Medicine

## 2021-08-18 DIAGNOSIS — S90219A Contusion of unspecified great toe with damage to nail, initial encounter: Secondary | ICD-10-CM

## 2021-08-18 DIAGNOSIS — Z85820 Personal history of malignant melanoma of skin: Secondary | ICD-10-CM | POA: Diagnosis not present

## 2021-08-18 DIAGNOSIS — E1165 Type 2 diabetes mellitus with hyperglycemia: Secondary | ICD-10-CM | POA: Insufficient documentation

## 2021-08-18 DIAGNOSIS — M7981 Nontraumatic hematoma of soft tissue: Secondary | ICD-10-CM | POA: Insufficient documentation

## 2021-08-18 DIAGNOSIS — M79674 Pain in right toe(s): Secondary | ICD-10-CM | POA: Diagnosis present

## 2021-08-18 DIAGNOSIS — I2510 Atherosclerotic heart disease of native coronary artery without angina pectoris: Secondary | ICD-10-CM | POA: Diagnosis not present

## 2021-08-18 DIAGNOSIS — I10 Essential (primary) hypertension: Secondary | ICD-10-CM | POA: Insufficient documentation

## 2021-08-18 LAB — CBC
HCT: 47.1 % (ref 39.0–52.0)
Hemoglobin: 16.1 g/dL (ref 13.0–17.0)
MCH: 29.4 pg (ref 26.0–34.0)
MCHC: 34.2 g/dL (ref 30.0–36.0)
MCV: 85.9 fL (ref 80.0–100.0)
Platelets: 143 10*3/uL — ABNORMAL LOW (ref 150–400)
RBC: 5.48 MIL/uL (ref 4.22–5.81)
RDW: 12.6 % (ref 11.5–15.5)
WBC: 7.1 10*3/uL (ref 4.0–10.5)
nRBC: 0 % (ref 0.0–0.2)

## 2021-08-18 LAB — BASIC METABOLIC PANEL
Anion gap: 6 (ref 5–15)
BUN: 8 mg/dL (ref 8–23)
CO2: 25 mmol/L (ref 22–32)
Calcium: 9.2 mg/dL (ref 8.9–10.3)
Chloride: 108 mmol/L (ref 98–111)
Creatinine, Ser: 0.88 mg/dL (ref 0.61–1.24)
GFR, Estimated: >60 mL/min (ref >60)
Glucose, Bld: 217 mg/dL — ABNORMAL HIGH (ref 70–99)
Potassium: 3.8 mmol/L (ref 3.5–5.1)
Sodium: 139 mmol/L (ref 135–145)

## 2021-08-18 LAB — LACTIC ACID, PLASMA: Lactic Acid, Venous: 1.6 mmol/L (ref 0.5–1.9)

## 2021-08-18 NOTE — ED Provider Notes (Signed)
[facility] Provider Note    Event Date/Time   First MD Initiated Contact with Patient 08/18/21 1215     (approximate)   History   Toe Pain   HPI  Francisco Reid is a 81 y.o. male with past medical history of chronic sinus bradycardia, CAD, HTN, HDL, DM, thyroid nodule and melanoma who presents for evaluation of 2 weeks of nontraumatic right first toe pain.  He states he thinks his shoes are too small and he is repetitively getting his nail caught on the inside of his shoe.  He denies any other foot pain, ankle pain, fevers or any other sick symptoms at this time.  No numbness.  He does not want any analgesia for this at this time.    Past Medical History:  Diagnosis Date   Cancer [facility])    melanoma   Chronic sinus bradycardia    Coronary artery disease    Diabetes mellitus without complication (HCC)    History of dysphagia    Hypercholesterolemia    Hypertension    Shortness of breath dyspnea    Thyroid nodule      Physical Exam  Triage Vital Signs: ED Triage Vitals [08/18/21 1144]  Enc Vitals Group     BP 133/90     Pulse Rate (!) 57     Resp 16     Temp 98.3 F (36.8 C)     Temp Source Oral     SpO2 97 %     Weight 210 lb (95.3 kg)     Height 6' (1.829 m)     Head Circumference      Peak Flow      Pain Score 9     Pain Loc      Pain Edu?      Excl. in [place]?     Most recent vital signs: Vitals:   08/18/21 1144  BP: 133/90  Pulse: (!) 57  Resp: 16  Temp: 98.3 F (36.8 C)  SpO2: 97%    General: Awake, no distress.  CV:  Good peripheral perfusion.  Resp:  Normal effort.  Abd:  No distention.  Other:  Patient has subungual hematoma on the right first toe and third toe.  There is no significant edema tenderness or fluctuance on the plantar aspect he is able to flex and extend it.  He is also with flexion extend the other toes.  Remainder of the foot and ankle is unremarkable.  2+ DP pulse.  Sensation is intact to light touch  throughout.  There is no purulence or significant asymmetric swelling on either side of the nail.   ED Results / Procedures / Treatments  Labs (all labs ordered are listed, but only abnormal results are displayed) Labs Reviewed  CBC - Abnormal; Notable for the following components:      Result Value   Platelets 143 (*)    All other components within normal limits  BASIC METABOLIC PANEL - Abnormal; Notable for the following components:   Glucose, Bld 217 (*)    All other components within normal limits  LACTIC ACID, PLASMA     EKG    RADIOLOGY  X-ray of the right first toe on my interpretation without evidence of acute fracture.  I also reviewed radiology interpretation and agree with their findings.   PROCEDURES:  Critical Care performed: No  Procedures   MEDICATIONS ORDERED IN ED: Medications - No data to display   IMPRESSION / MDM /  ASSESSMENT AND PLAN / ED COURSE  I reviewed the triage vital signs and the nursing notes. Patient's presentation is most consistent with acute, uncomplicated illness.                               Differential diagnosis includes, but is not limited to fracture versus isolated subungual hematoma I suspect from repetitive microtrauma from patient wearing shoes that are too small.  No evidence at this time of paronychia, felon I have a low suspicion for septic joint.  His nail itself is intact.  I offered attempted trephination which was attempted but no mL to obtain any output I suspect this is because it has been too long and supplement, is now completely clotted.  CBC without leukocytosis or acute anemia.  BMP is remarkable for glucose of 217 without any other significant electrolyte or metabolic derangements.  Lactic acid ordered in triage is not elevated at 1.6.  X-ray of the right first toe on my interpretation without evidence of acute fracture.  I also reviewed radiology interpretation and agree with their findings.  Overall this  point the patient is stable for close outpatient podiatry follow-up.  I have low suspicion at this time for other immediate life-threatening process.  Discussed with patient and he is agreeable to plan.  Discharged in stable condition.  Strict and precautions advised and discussed.      FINAL CLINICAL IMPRESSION(S) / ED DIAGNOSES   Final diagnoses:  Pain of toe of right foot  Subungual hematoma of great toe     Rx / DC Orders   ED Discharge Orders     None        Note:  This document was prepared using Dragon voice recognition software and may include unintentional dictation errors.   Lucrezia Starch, MD 08/18/21 1256

## 2021-08-18 NOTE — ED Triage Notes (Signed)
Pt here with toe pain.Pt states his great toe on his right foot has been hurting for the past 2 weeks. Pt's toe nail is discolored. Pt states pain does not radiate. Pt uses a walker normally.

## 2023-02-11 ENCOUNTER — Emergency Department (HOSPITAL_COMMUNITY): Payer: Medicare Other

## 2023-02-11 ENCOUNTER — Encounter (HOSPITAL_COMMUNITY): Payer: Self-pay

## 2023-02-11 ENCOUNTER — Other Ambulatory Visit: Payer: Self-pay

## 2023-02-11 ENCOUNTER — Inpatient Hospital Stay (HOSPITAL_COMMUNITY)
Admission: EM | Admit: 2023-02-11 | Discharge: 2023-02-14 | DRG: 065 | Disposition: A | Discharge status: Skilled Nursing Facility | Payer: Medicare Other | Attending: Family Medicine | Admitting: Family Medicine

## 2023-02-11 DIAGNOSIS — Z7984 Long term (current) use of oral hypoglycemic drugs: Secondary | ICD-10-CM

## 2023-02-11 DIAGNOSIS — G8101 Flaccid hemiplegia affecting right dominant side: Secondary | ICD-10-CM | POA: Diagnosis present

## 2023-02-11 DIAGNOSIS — Z8673 Personal history of transient ischemic attack (TIA), and cerebral infarction without residual deficits: Secondary | ICD-10-CM

## 2023-02-11 DIAGNOSIS — Z87891 Personal history of nicotine dependence: Secondary | ICD-10-CM

## 2023-02-11 DIAGNOSIS — Z7982 Long term (current) use of aspirin: Secondary | ICD-10-CM

## 2023-02-11 DIAGNOSIS — I63412 Cerebral infarction due to embolism of left middle cerebral artery: Principal | ICD-10-CM | POA: Diagnosis present

## 2023-02-11 DIAGNOSIS — E119 Type 2 diabetes mellitus without complications: Secondary | ICD-10-CM | POA: Diagnosis present

## 2023-02-11 DIAGNOSIS — E7800 Pure hypercholesterolemia, unspecified: Secondary | ICD-10-CM | POA: Diagnosis present

## 2023-02-11 DIAGNOSIS — Z888 Allergy status to other drugs, medicaments and biological substances status: Secondary | ICD-10-CM

## 2023-02-11 DIAGNOSIS — I2510 Atherosclerotic heart disease of native coronary artery without angina pectoris: Secondary | ICD-10-CM | POA: Diagnosis present

## 2023-02-11 DIAGNOSIS — I639 Cerebral infarction, unspecified: Principal | ICD-10-CM

## 2023-02-11 DIAGNOSIS — Z7902 Long term (current) use of antithrombotics/antiplatelets: Secondary | ICD-10-CM

## 2023-02-11 DIAGNOSIS — Z79899 Other long term (current) drug therapy: Secondary | ICD-10-CM

## 2023-02-11 DIAGNOSIS — R531 Weakness: Secondary | ICD-10-CM | POA: Diagnosis not present

## 2023-02-11 DIAGNOSIS — Z881 Allergy status to other antibiotic agents status: Secondary | ICD-10-CM

## 2023-02-11 DIAGNOSIS — I110 Hypertensive heart disease with heart failure: Secondary | ICD-10-CM | POA: Diagnosis present

## 2023-02-11 DIAGNOSIS — R4189 Other symptoms and signs involving cognitive functions and awareness: Secondary | ICD-10-CM | POA: Diagnosis present

## 2023-02-11 DIAGNOSIS — I5022 Chronic systolic (congestive) heart failure: Secondary | ICD-10-CM | POA: Diagnosis present

## 2023-02-11 DIAGNOSIS — Z955 Presence of coronary angioplasty implant and graft: Secondary | ICD-10-CM

## 2023-02-11 DIAGNOSIS — G40909 Epilepsy, unspecified, not intractable, without status epilepticus: Secondary | ICD-10-CM | POA: Diagnosis present

## 2023-02-11 DIAGNOSIS — F05 Delirium due to known physiological condition: Secondary | ICD-10-CM | POA: Diagnosis present

## 2023-02-11 DIAGNOSIS — Z85820 Personal history of malignant melanoma of skin: Secondary | ICD-10-CM

## 2023-02-11 DIAGNOSIS — R2990 Unspecified symptoms and signs involving the nervous system: Principal | ICD-10-CM

## 2023-02-11 DIAGNOSIS — R29707 NIHSS score 7: Secondary | ICD-10-CM | POA: Diagnosis present

## 2023-02-11 LAB — CBC
HCT: 48.2 % (ref 39.0–52.0)
Hemoglobin: 16.4 g/dL (ref 13.0–17.0)
MCH: 30.3 pg (ref 26.0–34.0)
MCHC: 34 g/dL (ref 30.0–36.0)
MCV: 89.1 fL (ref 80.0–100.0)
Platelets: 129 10*3/uL — ABNORMAL LOW (ref 150–400)
RBC: 5.41 MIL/uL (ref 4.22–5.81)
RDW: 13.2 % (ref 11.5–15.5)
WBC: 8 10*3/uL (ref 4.0–10.5)
nRBC: 0 % (ref 0.0–0.2)

## 2023-02-11 LAB — RAPID URINE DRUG SCREEN, HOSP PERFORMED
Amphetamines: NOT DETECTED
Barbiturates: NOT DETECTED
Benzodiazepines: NOT DETECTED
Cocaine: NOT DETECTED
Opiates: NOT DETECTED
Tetrahydrocannabinol: NOT DETECTED

## 2023-02-11 LAB — I-STAT CHEM 8, ED
BUN: 14 mg/dL (ref 8–23)
Calcium, Ion: 1.15 mmol/L (ref 1.15–1.40)
Chloride: 102 mmol/L (ref 98–111)
Creatinine, Ser: 1.1 mg/dL (ref 0.61–1.24)
Glucose, Bld: 105 mg/dL — ABNORMAL HIGH (ref 70–99)
HCT: 48 % (ref 39.0–52.0)
Hemoglobin: 16.3 g/dL (ref 13.0–17.0)
Potassium: 3.9 mmol/L (ref 3.5–5.1)
Sodium: 141 mmol/L (ref 135–145)
TCO2: 27 mmol/L (ref 22–32)

## 2023-02-11 LAB — COMPREHENSIVE METABOLIC PANEL
ALT: 17 U/L (ref 0–44)
AST: 21 U/L (ref 15–41)
Albumin: 3.9 g/dL (ref 3.5–5.0)
Alkaline Phosphatase: 104 U/L (ref 38–126)
Anion gap: 7 (ref 5–15)
BUN: 14 mg/dL (ref 8–23)
CO2: 26 mmol/L (ref 22–32)
Calcium: 8.9 mg/dL (ref 8.9–10.3)
Chloride: 103 mmol/L (ref 98–111)
Creatinine, Ser: 1.02 mg/dL (ref 0.61–1.24)
GFR, Estimated: >60 mL/min (ref >60)
Glucose, Bld: 108 mg/dL — ABNORMAL HIGH (ref 70–99)
Potassium: 3.8 mmol/L (ref 3.5–5.1)
Sodium: 136 mmol/L (ref 135–145)
Total Bilirubin: 0.8 mg/dL (ref 0.0–1.2)
Total Protein: 6.8 g/dL (ref 6.5–8.1)

## 2023-02-11 LAB — DIFFERENTIAL
Abs Immature Granulocytes: 0.02 10*3/uL (ref 0.00–0.07)
Basophils Absolute: 0 10*3/uL (ref 0.0–0.1)
Basophils Relative: 1 %
Eosinophils Absolute: 0.1 10*3/uL (ref 0.0–0.5)
Eosinophils Relative: 1 %
Immature Granulocytes: 0 %
Lymphocytes Relative: 19 %
Lymphs Abs: 1.5 10*3/uL (ref 0.7–4.0)
Monocytes Absolute: 0.6 10*3/uL (ref 0.1–1.0)
Monocytes Relative: 8 %
Neutro Abs: 5.7 10*3/uL (ref 1.7–7.7)
Neutrophils Relative %: 71 %

## 2023-02-11 LAB — URINALYSIS, ROUTINE W REFLEX MICROSCOPIC
Bilirubin Urine: NEGATIVE
Glucose, UA: NEGATIVE mg/dL
Hgb urine dipstick: NEGATIVE
Ketones, ur: NEGATIVE mg/dL
Leukocytes,Ua: NEGATIVE
Nitrite: NEGATIVE
Protein, ur: NEGATIVE mg/dL
Specific Gravity, Urine: 1.042 — ABNORMAL HIGH (ref 1.005–1.030)
pH: 7 (ref 5.0–8.0)

## 2023-02-11 LAB — HEMOGLOBIN A1C
Hgb A1c MFr Bld: 6.2 % — ABNORMAL HIGH (ref 4.8–5.6)
Mean Plasma Glucose: 131.24 mg/dL

## 2023-02-11 LAB — CBG MONITORING, ED: Glucose-Capillary: 124 mg/dL — ABNORMAL HIGH (ref 70–99)

## 2023-02-11 LAB — ETHANOL: Alcohol, Ethyl (B): <10 mg/dL (ref <10)

## 2023-02-11 LAB — PROTIME-INR
INR: 1.1 (ref 0.8–1.2)
Prothrombin Time: 14 s (ref 11.4–15.2)

## 2023-02-11 LAB — APTT: aPTT: 30 s (ref 24–36)

## 2023-02-11 MED ORDER — IOHEXOL 350 MG/ML SOLN
100.0000 mL | Freq: Once | INTRAVENOUS | Status: AC | PRN
  Administered 2023-02-11: 100 mL via INTRAVENOUS

## 2023-02-11 MED ORDER — ACETAMINOPHEN 650 MG RE SUPP: 650.0000 mg | RECTAL | Status: DC | PRN

## 2023-02-11 MED ORDER — CLOPIDOGREL BISULFATE 75 MG PO TABS
75.0000 mg | ORAL_TABLET | Freq: Every day | ORAL | Status: DC
  Administered 2023-02-11: 75 mg via ORAL
  Filled 2023-02-11: qty 1

## 2023-02-11 MED ORDER — ASPIRIN 81 MG PO TBEC
81.0000 mg | DELAYED_RELEASE_TABLET | Freq: Every day | ORAL | Status: DC
  Administered 2023-02-11: 81 mg via ORAL
  Filled 2023-02-11: qty 1

## 2023-02-11 MED ORDER — ACETAMINOPHEN 325 MG PO TABS
650.0000 mg | ORAL_TABLET | ORAL | Status: DC | PRN
  Administered 2023-02-13 (×2): 650 mg via ORAL
  Filled 2023-02-11: qty 2

## 2023-02-11 MED ORDER — ATORVASTATIN CALCIUM 40 MG PO TABS
40.0000 mg | ORAL_TABLET | Freq: Every day | ORAL | Status: DC
  Administered 2023-02-12 – 2023-02-14 (×3): 40 mg via ORAL
  Filled 2023-02-11 (×3): qty 1

## 2023-02-11 MED ORDER — SODIUM CHLORIDE 0.9% FLUSH: 3.0000 mL | INTRAVENOUS | Status: DC | PRN

## 2023-02-11 MED ORDER — ENOXAPARIN SODIUM 40 MG/0.4ML IJ SOSY
40.0000 mg | PREFILLED_SYRINGE | INTRAMUSCULAR | Status: DC
  Administered 2023-02-11: 40 mg via SUBCUTANEOUS
  Filled 2023-02-11: qty 0.4

## 2023-02-11 MED ORDER — STROKE: EARLY STAGES OF RECOVERY BOOK
Freq: Once | Status: AC
  Filled 2023-02-11: qty 1

## 2023-02-11 MED ORDER — ACETAMINOPHEN 160 MG/5ML PO SOLN: 650.0000 mg | ORAL | Status: DC | PRN

## 2023-02-11 MED ORDER — SODIUM CHLORIDE 0.9% FLUSH
3.0000 mL | Freq: Two times a day (BID) | INTRAVENOUS | Status: DC
  Administered 2023-02-11 – 2023-02-12 (×2): 3 mL via INTRAVENOUS
  Administered 2023-02-12: 10 mL via INTRAVENOUS
  Administered 2023-02-13: 5 mL via INTRAVENOUS
  Administered 2023-02-13 – 2023-02-14 (×2): 10 mL via INTRAVENOUS

## 2023-02-11 NOTE — ED Provider Notes (Signed)
 [place] EMERGENCY DEPARTMENT AT [facility] Provider Note   CSN: [id number] Arrival date & time: 02/11/23  1316     History  Chief Complaint  Patient presents with   Code Stroke    Francisco Reid is a 83 y.o. male.  HPI Patient presents for survey symptoms.  He states that he was in his normal state of health yesterday.  Last night, at around bedtime, 10 PM, he experienced right arm weakness.  This has continued today.  Patient was brought to the ED by nephew.  Patient denies any areas of pain.  Per chart review, his medical history includes CAD, DM, HLD, HTN.  He is not prescribed a blood thinner.  He is prescribed aspirin  and Plavix .    Home Medications Prior to Admission medications   Medication Sig Start Date End Date Taking? Authorizing Provider  aspirin  EC 81 MG tablet Take 81 mg by mouth daily.    [provider]  atorvastatin  (LIPITOR) 40 MG tablet Take 1 tablet by mouth at bedtime.    [provider]  clopidogrel  (PLAVIX ) 75 MG tablet Take 75 mg by mouth daily.    [provider]  hydrALAZINE  (APRESOLINE ) 25 MG tablet Take 1 tablet (25 mg total) by mouth every 8 (eight) hours. Patient not taking: Reported on 02/24/2018 06/25/15   Konidena, Snehalatha, MD  hydrochlorothiazide (HYDRODIURIL) 12.5 MG tablet Take 12.5 mg by mouth daily. 12/11/17   [provider]  ibuprofen  (ADVIL ) 800 MG tablet Take 1 tablet (800 mg total) by mouth every 8 (eight) hours as needed for moderate pain. 01/04/21   Zammit, Joseph, MD  metFORMIN (GLUCOPHAGE) 1000 MG tablet Take 1,000 mg by mouth 2 (two) times daily with a meal. 12/11/17   [provider]  metFORMIN (GLUMETZA) 500 MG (MOD) 24 hr tablet Take 500 mg by mouth every evening.     [provider]  sulfamethoxazole-trimethoprim (BACTRIM DS,SEPTRA DS) 800-160 MG tablet Take 1 tablet by mouth 2 (two) times daily.    [provider]  tamsulosin  (FLOMAX ) 0.4 MG CAPS capsule  Take 0.4 mg by mouth daily.    [provider]      Allergies    Enalapril and Keflet [cephalexin]    Review of Systems   Review of Systems  Neurological:  Positive for weakness.  All other systems reviewed and are negative.   Physical Exam Updated Vital Signs BP (!) 189/90 (BP Location: Right Arm)   Pulse 69   Temp 98.6 F (37 C) (Oral)   Resp 15   Ht 6' (1.829 m)   Wt 95.3 kg   SpO2 97%   BMI 28.49 kg/m  Physical Exam Vitals and nursing note reviewed.  Constitutional:      General: He is not in acute distress.    Appearance: Normal appearance. He is well-developed. He is not ill-appearing, toxic-appearing or diaphoretic.  HENT:     Head: Normocephalic and atraumatic.     Right Ear: External ear normal.     Left Ear: External ear normal.     Nose: Nose normal.     Mouth/Throat:     Mouth: Mucous membranes are moist.  Eyes:     Extraocular Movements: Extraocular movements intact.     Conjunctiva/sclera: Conjunctivae normal.  Cardiovascular:     Rate and Rhythm: Normal rate and regular rhythm.  Pulmonary:     Effort: Pulmonary effort is normal. No respiratory distress.  Abdominal:     General: There  is no distension.     Palpations: Abdomen is soft.  Musculoskeletal:        General: No swelling or deformity.     Cervical back: Normal range of motion and neck supple.  Skin:    General: Skin is warm and dry.  Neurological:     Mental Status: He is alert and oriented to person, place, and time.     Cranial Nerves: No cranial nerve deficit, dysarthria or facial asymmetry.     Sensory: Sensation is intact.     Motor: Weakness present.     Comments: Flaccid right arm.  Denies diminished sensation.  Psychiatric:        Mood and Affect: Mood normal.        Behavior: Behavior normal.     ED Results / Procedures / Treatments   Labs (all labs ordered are listed, but only abnormal results are displayed) Labs Reviewed  CBC - Abnormal; Notable for the  following components:      Result Value   Platelets 129 (*)    All other components within normal limits  COMPREHENSIVE METABOLIC PANEL - Abnormal; Notable for the following components:   Glucose, Bld 108 (*)    All other components within normal limits  I-STAT CHEM 8, ED - Abnormal; Notable for the following components:   Glucose, Bld 105 (*)    All other components within normal limits  CBG MONITORING, ED - Abnormal; Notable for the following components:   Glucose-Capillary 124 (*)    All other components within normal limits  PROTIME-INR  APTT  DIFFERENTIAL  ETHANOL  RAPID URINE DRUG SCREEN, HOSP PERFORMED  URINALYSIS, ROUTINE W REFLEX MICROSCOPIC    EKG None  Radiology CT ANGIO HEAD NECK W WO CM W PERF (CODE STROKE) Result Date: 02/11/2023 CLINICAL DATA:  Neuro deficit, acute, stroke suspected RUE flaccid paralysis EXAM: CT ANGIOGRAPHY HEAD AND NECK CT PERFUSION BRAIN TECHNIQUE: Multidetector CT imaging of the head and neck was performed using the standard protocol during bolus administration of intravenous contrast. Multiplanar CT image reconstructions and MIPs were obtained to evaluate the vascular anatomy. Carotid stenosis measurements (when applicable) are obtained utilizing NASCET criteria, using the distal internal carotid diameter as the denominator. Multiphase CT imaging of the brain was performed following IV bolus contrast injection. Subsequent parametric perfusion maps were calculated using RAPID software. RADIATION DOSE REDUCTION: This exam was performed according to the departmental dose-optimization program which includes automated exposure control, adjustment of the mA and/or kV according to patient size and/or use of iterative reconstruction technique. CONTRAST:  100mL OMNIPAQUE  IOHEXOL  350 MG/ML SOLN COMPARISON:  None Available. FINDINGS: CTA NECK FINDINGS Aortic arch: Aortic atherosclerosis. Great vessel origins are patent without significant stenosis. Right carotid  system: Atherosclerosis at the carotid bifurcation and involving the proximal ICA with proximally 50% stenosis of the proximal ICA. Left carotid system: Atherosclerosis at the carotid bifurcation and involving the proximal ICA without greater than 50% stenosis. Vertebral arteries: Right dominant. No evidence of dissection, stenosis (50% or greater) or occlusion in the neck. Skeleton: Other neck: Upper chest: Review of the MIP images confirms the above findings CTA HEAD FINDINGS Anterior circulation: Bilateral intracranial ICAs are patent with moderate left and mild right cavernous ICA narrowing. Bilateral MCAs and ACAs are patent without proximal hemodynamically significant stenosis. 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 Right intradural vertebral artery is patent with mild stenosis at its dural margin. Basilar artery and bilateral posterior cerebral arteries are patent  without proximal hemodynamically significant stenosis. Venous sinuses: As permitted by contrast timing, patent. Review of the MIP images confirms the above findings CT Brain Perfusion Findings: ASPECTS: 9 CBF (<30%) Volume: 0mL Perfusion (Tmax>6.0s) volume: 0mL Mismatch Volume: 0mL Infarction Location:None identified. IMPRESSION: CTA: 1. 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 2. 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 3. Approximately 50% stenosis of the proximal right ICA. 4.  Aortic Atherosclerosis (ICD10-I70.0). CT perfusion: No evidence of core infarct or penumbra. Preliminary results were communicated on 02/11/2023 at 2:26 pm to provider Dr. Matthews via secure text paging. Electronically Signed   By: Gilmore GORMAN Molt M.D.   On: 02/11/2023 14:27   CT HEAD CODE STROKE WO CONTRAST Result Date: 02/11/2023 CLINICAL DATA:  Code stroke. Provided history: Neuro deficit, acute, stroke suspected. EXAM: CT HEAD WITHOUT CONTRAST TECHNIQUE: Contiguous axial  images were obtained from the base of the skull through the vertex without intravenous contrast. RADIATION DOSE REDUCTION: This exam was performed according to the departmental dose-optimization program which includes automated exposure control, adjustment of the mA and/or kV according to patient size and/or use of iterative reconstruction technique. COMPARISON:  None. FINDINGS: Brain: Generalized parenchymal atrophy. Commensurate prominence of the ventricles and sulci Age-indeterminate lacunar infarcts within the left centrum semiovale, left caudate nucleus and bilateral corona radiata. Moderate patchy and ill-defined hypoattenuation within the cerebral white matter, nonspecific but compatible with chronic small vessel ischemic disease. There is no acute intracranial hemorrhage. No demarcated cortical infarct. No extra-axial fluid collection. No evidence of an intracranial mass. No midline shift. Vascular: No hyperdense vessel.  Atherosclerotic calcifications. Skull: No calvarial fracture or aggressive osseous lesion. Sinuses/Orbits: No mass or acute finding within the imaged orbits. No significant paranasal sinus disease. ASPECTS (Alberta Stroke Program Early CT Score) - Ganglionic level infarction (caudate, lentiform nuclei, internal capsule, insula, M1-M3 cortex): 6 - Supraganglionic infarction (M4-M6 cortex): 3 Total score (0-10 with 10 being normal): 9 These results were called by telephone at the time of interpretation on 02/11/2023 at 1:52 pm to provider BERNARDINO FIREMAN , who verbally acknowledged these results. IMPRESSION: 1. Age-indeterminate lacunar infarcts within the left centrum semiovale, left caudate nucleus and bilateral corona radiata. ASPECTS is 9. 2. Background parenchymal atrophy and chronic small vessel ischemic disease. Electronically Signed   By: Rockey Childs D.O.   On: 02/11/2023 13:54    Procedures Procedures    Medications Ordered in ED Medications  iohexol  (OMNIPAQUE ) 350 MG/ML  injection 100 mL (100 mLs Intravenous Contrast Given 02/11/23 1354)    ED Course/ Medical Decision Making/ A&P                                 Medical Decision Making Amount and/or Complexity of Data Reviewed Labs: ordered. Radiology: ordered.  Risk Decision regarding hospitalization.   This patient presents to the ED for concern of right arm weakness, this involves an extensive number of treatment options, and is a complaint that carries with it a high risk of complications and morbidity.  The differential diagnosis includes CVA, neoplasm, ICH, PAD   Co morbidities that complicate the patient evaluation  CAD, DM, HLD, HTN   Additional history obtained:  Additional history obtained from EMS, patient's nephew External records from outside source obtained and reviewed including MR   Lab Tests:  I Ordered, and personally interpreted labs.  The pertinent results include: Normal hemoglobin, no leukocytosis, normal kidney function, normal electrolytes  Imaging Studies ordered:  I ordered imaging studies including CT head, CTA head and neck, MRI brain I independently visualized and interpreted imaging which showed terminally occluded hypoplastic L vert artery, areas of stenosis, MRI pending at time of admission I agree with the radiologist interpretation   Cardiac Monitoring: / EKG:  The patient was maintained on a cardiac monitor.  I personally viewed and interpreted the cardiac monitored which showed an underlying rhythm of: Sinus rhythm   Consultations Obtained:  I requested consultation with the neurologist, Dr. Matthews,  and discussed lab and imaging findings as well as pertinent plan - they recommend: [facility] for CVA workup   Problem List / ED Course / Critical interventions / Medication management  Patient presents for right arm weakness.  This was noticed by home health aide this morning.  Patient stated that he noticed it last night, around bedtime.   This is estimated to be 10 PM.  This would be his last known well.  On arrival, he has flaccid paralysis of his right arm.  He denies any diminished sensation.  He does not seem to have any other focal deficits.  Given concern for LVO within the past 24 hours, code stroke was initiated.  Noncontrasted CT scan of head showed age-indeterminate lacunar infarcts.  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  MRI was pending at time of admission.  Neurology recommends admission to [facility] for CVA workup.  Patient was admitted for further management.   Social Determinants of Health:  Has PCP  CRITICAL CARE Performed by: Bernardino Fireman   Total critical care time: 32 minutes  Critical care time was exclusive of separately billable procedures and treating other patients.  Critical care was necessary to treat or prevent imminent or life-threatening deterioration.  Critical care was time spent personally by me on the following activities: development of treatment plan with patient and/or surrogate as well as nursing, discussions with consultants, evaluation of patient's response to treatment, examination of patient, obtaining history from patient or surrogate, ordering and performing treatments and interventions, ordering and review of laboratory studies, ordering and review of radiographic studies, pulse oximetry and re-evaluation of patient's condition.        Final Clinical Impression(s) / ED Diagnoses Final diagnoses:  Stroke-like symptoms    Rx / DC Orders ED Discharge Orders     None         Fireman Bernardino, MD 02/11/23 [phone]

## 2023-02-11 NOTE — ED Notes (Signed)
CBG 124 

## 2023-02-11 NOTE — Progress Notes (Addendum)
 Stroke Alert cart activation at 1322. LKW 2200 woke with right sided weakness. [facility], Dr Matthews paged at 1323. Patient and cart to CT department at 1324. Dr Rondi Matthews joins the telemedicine cart at 1331. Patient done with primary imaging and Neurologist examining at 1333. 1339 staff directed to move patient back to scanner for advanced imaging. IVs by staff at that time. 1352 CT's resumed. 1359 imaging complete and patient and cart back to ED room at 1401. Imaging self review by Neurologist at that time- No LVO. MRS 3. Telestroke CHARITY FUNDRAISER, Avelina Magnuson

## 2023-02-11 NOTE — ED Notes (Signed)
 ED TO INPATIENT HANDOFF REPORT  ED Nurse Name and Phone #: Vicenta Valery RAMAN Name/Age/Gender Francisco Reid 83 y.o. male Room/Bed: APA03/APA03  Code Status   Code Status: Full Code  Home/SNF/Other Home Patient oriented to: self, place, time, and situation Is this baseline? Seems to have dementia, so will know and forget sometimes   Triage Complete: Triage complete  Chief Complaint CVA (cerebral vascular accident) [facility]) [I63.9]  Triage Note Pt c/o rt arm and leg weakness. Pt and family states that it started after waking up this morning. Per family pt able to ambulate but unable to move rt arm.    Allergies Allergies  Allergen Reactions   Enalapril    Keflet [Cephalexin] Rash    Level of Care/Admitting Diagnosis ED Disposition     ED Disposition  Admit   Condition  --   Comment  Hospital Area: [facility] [[id number]]  Level of Care: Telemetry [5]  Covid Evaluation: Asymptomatic - no recent exposure (last 10 days) testing not required  Diagnosis: CVA (cerebral vascular accident) [facility]) [[id number]]  Admitting Physician: LENON MARIEN CROME [[id number]]  Attending Physician: LENON MARIEN CROME [[id number]]          B Medical/Surgery History Past Medical History:  Diagnosis Date   Cancer (HCC)    melanoma   Chronic sinus bradycardia    Coronary artery disease    Diabetes mellitus without complication (HCC)    History of dysphagia    Hypercholesterolemia    Hypertension    Shortness of breath dyspnea    Thyroid  nodule    Past Surgical History:  Procedure Laterality Date   c-5 neck surgery     COLON SURGERY     COLONOSCOPY WITH ESOPHAGOGASTRODUODENOSCOPY (EGD)     COLONOSCOPY WITH PROPOFOL  N/A 07/12/2015   Procedure: COLONOSCOPY WITH PROPOFOL ;  Surgeon: Gladis RAYMOND Mariner, MD;  Location: [facility] ENDOSCOPY;  Service: Endoscopy;  Laterality: N/A;   CORONARY ANGIOPLASTY WITH STENT PLACEMENT     ESOPHAGOGASTRODUODENOSCOPY (EGD) WITH PROPOFOL  N/A 07/12/2015    Procedure: ESOPHAGOGASTRODUODENOSCOPY (EGD) WITH PROPOFOL ;  Surgeon: Gladis RAYMOND Mariner, MD;  Location: [facility] ENDOSCOPY;  Service: Endoscopy;  Laterality: N/A;   HERNIA REPAIR       A IV Location/Drains/Wounds Patient Lines/Drains/Airways Status     Active Line/Drains/Airways     Name Placement date Placement time Site Days   Peripheral IV 02/11/23 18 G 1.16 Anterior;Distal;Left;Upper Arm 02/11/23  1330  Arm  less than 1   Peripheral IV 02/11/23 20 G 1 Anterior;Left Forearm 02/11/23  1320  Forearm  less than 1   Airway 07/12/15  1304  -- 2771            Intake/Output Last 24 hours No intake or output data in the 24 hours ending 02/11/23 1701  Labs/Imaging Results for orders placed or performed during the hospital encounter of 02/11/23 (from the past 48 hours)  CBG monitoring, ED     Status: Abnormal   Collection Time: 02/11/23  1:24 PM  Result Value Ref Range   Glucose-Capillary 124 (H) 70 - 99 mg/dL    Comment: Glucose reference range applies only to samples taken after fasting for at least 8 hours.  Protime-INR     Status: None   Collection Time: 02/11/23  1:40 PM  Result Value Ref Range   Prothrombin Time 14.0 11.4 - 15.2 seconds   INR 1.1 0.8 - 1.2    Comment: (NOTE) INR goal varies based on device and disease states. Performed at  [facility], [address] [place], [place] [address]   APTT     Status: None   Collection Time: 02/11/23  1:40 PM  Result Value Ref Range   aPTT 30 24 - 36 seconds    Comment: Performed at [facility], [address] [place], [place] [address]  CBC     Status: Abnormal   Collection Time: 02/11/23  1:40 PM  Result Value Ref Range   WBC 8.0 4.0 - 10.5 K/uL   RBC 5.41 4.22 - 5.81 MIL/uL   Hemoglobin 16.4 13.0 - 17.0 g/dL   HCT 51.7 60.9 - 47.9 %   MCV 89.1 80.0 - 100.0 fL   MCH 30.3 26.0 - 34.0 pg   MCHC 34.0 30.0 - 36.0 g/dL   RDW 86.7 88.4 - 84.4 %   Platelets 129 (L) 150 - 400 K/uL   nRBC 0.0 0.0 - 0.2 %    Comment:  Performed at [facility], [address], [place], [place] [address]  Differential     Status: None   Collection Time: 02/11/23  1:40 PM  Result Value Ref Range   Neutrophils Relative % 71 %   Neutro Abs 5.7 1.7 - 7.7 K/uL   Lymphocytes Relative 19 %   Lymphs Abs 1.5 0.7 - 4.0 K/uL   Monocytes Relative 8 %   Monocytes Absolute 0.6 0.1 - 1.0 K/uL   Eosinophils Relative 1 %   Eosinophils Absolute 0.1 0.0 - 0.5 K/uL   Basophils Relative 1 %   Basophils Absolute 0.0 0.0 - 0.1 K/uL   Immature Granulocytes 0 %   Abs Immature Granulocytes 0.02 0.00 - 0.07 K/uL    Comment: Performed at [facility], [address], [place], [place] [address]  Comprehensive metabolic panel     Status: Abnormal   Collection Time: 02/11/23  1:40 PM  Result Value Ref Range   Sodium 136 135 - 145 mmol/L   Potassium 3.8 3.5 - 5.1 mmol/L   Chloride 103 98 - 111 mmol/L   CO2 26 22 - 32 mmol/L   Glucose, Bld 108 (H) 70 - 99 mg/dL    Comment: Glucose reference range applies only to samples taken after fasting for at least 8 hours.   BUN 14 8 - 23 mg/dL   Creatinine, Ser 8.97 0.61 - 1.24 mg/dL   Calcium  8.9 8.9 - 10.3 mg/dL   Total Protein 6.8 6.5 - 8.1 g/dL   Albumin 3.9 3.5 - 5.0 g/dL   AST 21 15 - 41 U/L   ALT 17 0 - 44 U/L   Alkaline Phosphatase 104 38 - 126 U/L   Total Bilirubin 0.8 0.0 - 1.2 mg/dL   GFR, Estimated >39 >39 mL/min    Comment: (NOTE) Calculated using the CKD-EPI Creatinine Equation (2021)    Anion gap 7 5 - 15    Comment: Performed at [facility], [address], [place], [place] [address]  Ethanol     Status: None   Collection Time: 02/11/23  1:40 PM  Result Value Ref Range   Alcohol, Ethyl (B) <10 <10 mg/dL    Comment: (NOTE) Lowest detectable limit for serum alcohol is 10 mg/dL.  For medical purposes only. Performed at [facility], [address], [place], [place] [address]   Dozier brew 8, ED     Status: Abnormal   Collection Time: 02/11/23  1:47 PM  Result Value Ref Range    Sodium 141 135 - 145 mmol/L   Potassium 3.9  3.5 - 5.1 mmol/L   Chloride 102 98 - 111 mmol/L   BUN 14 8 - 23 mg/dL   Creatinine, Ser 8.89 0.61 - 1.24 mg/dL   Glucose, Bld 894 (H) 70 - 99 mg/dL    Comment: Glucose reference range applies only to samples taken after fasting for at least 8 hours.   Calcium , Ion 1.15 1.15 - 1.40 mmol/L   TCO2 27 22 - 32 mmol/L   Hemoglobin 16.3 13.0 - 17.0 g/dL   HCT 51.9 60.9 - 47.9 %  Urinalysis, Routine w reflex microscopic -Urine, Clean Catch     Status: Abnormal   Collection Time: 02/11/23  2:30 PM  Result Value Ref Range   Color, Urine YELLOW YELLOW   APPearance CLEAR CLEAR   Specific Gravity, Urine 1.042 (H) 1.005 - 1.030   pH 7.0 5.0 - 8.0   Glucose, UA NEGATIVE NEGATIVE mg/dL   Hgb urine dipstick NEGATIVE NEGATIVE   Bilirubin Urine NEGATIVE NEGATIVE   Ketones, ur NEGATIVE NEGATIVE mg/dL   Protein, ur NEGATIVE NEGATIVE mg/dL   Nitrite NEGATIVE NEGATIVE   Leukocytes,Ua NEGATIVE NEGATIVE    Comment: Performed at [facility], [address] [place], [place] [address]  Urine rapid drug screen (hosp performed)     Status: None   Collection Time: 02/11/23  2:38 PM  Result Value Ref Range   Opiates NONE DETECTED NONE DETECTED   Cocaine NONE DETECTED NONE DETECTED   Benzodiazepines NONE DETECTED NONE DETECTED   Amphetamines NONE DETECTED NONE DETECTED   Tetrahydrocannabinol NONE DETECTED NONE DETECTED   Barbiturates NONE DETECTED NONE DETECTED    Comment: (NOTE) DRUG SCREEN FOR MEDICAL PURPOSES ONLY.  IF CONFIRMATION IS NEEDED FOR ANY PURPOSE, NOTIFY LAB WITHIN 5 DAYS.  LOWEST DETECTABLE LIMITS FOR URINE DRUG SCREEN Drug Class                     Cutoff (ng/mL) Amphetamine and metabolites    1000 Barbiturate and metabolites    200 Benzodiazepine                 200 Opiates and metabolites        300 Cocaine and metabolites        300 THC                            50 Performed at [facility], [address], [place], [place] [address]     CT ANGIO HEAD NECK W WO CM W PERF (CODE STROKE) Result Date: 02/11/2023 CLINICAL DATA:  Neuro deficit, acute, stroke suspected RUE flaccid paralysis EXAM: CT ANGIOGRAPHY HEAD AND NECK CT PERFUSION BRAIN TECHNIQUE: Multidetector CT imaging of the head and neck was performed using the standard protocol during bolus administration of intravenous contrast. Multiplanar CT image reconstructions and MIPs were obtained to evaluate the vascular anatomy. Carotid stenosis measurements (when applicable) are obtained utilizing NASCET criteria, using the distal internal carotid diameter as the denominator. Multiphase CT imaging of the brain was performed following IV bolus contrast injection. Subsequent parametric perfusion maps were calculated using RAPID software. RADIATION DOSE REDUCTION: This exam was performed according to the departmental dose-optimization program which includes automated exposure control, adjustment of the mA and/or kV according to patient size and/or use of iterative reconstruction technique. CONTRAST:  OMNIPAQUE  IOHEXOL  350 MG/ML SOLN COMPARISON:  None Available. FINDINGS: CTA NECK FINDINGS Aortic arch: Aortic atherosclerosis. Great vessel origins are patent without significant stenosis. Right  carotid system: Atherosclerosis at the carotid bifurcation and involving the proximal ICA with proximally 50% stenosis of the proximal ICA. Left carotid system: Atherosclerosis at the carotid bifurcation and involving the proximal ICA without greater than 50% stenosis. Vertebral arteries: Right dominant. No evidence of dissection, stenosis (50% or greater) or occlusion in the neck. Skeleton: Other neck: Upper chest: Review of the MIP images confirms the above findings CTA HEAD FINDINGS Anterior circulation: Bilateral intracranial ICAs are patent with moderate left and mild right cavernous ICA narrowing. Bilateral MCAs and ACAs are patent without proximal hemodynamically significant stenosis. 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 Right intradural vertebral artery is patent with mild stenosis at its dural margin. Basilar artery and bilateral posterior cerebral arteries are patent without proximal hemodynamically significant stenosis. Venous sinuses: As permitted by contrast timing, patent. Review of the MIP images confirms the above findings CT Brain Perfusion Findings: ASPECTS: 9 CBF (<30%) Volume: 0mL Perfusion (Tmax>6.0s) volume: 0mL Mismatch Volume: 0mL Infarction Location:None identified. IMPRESSION: CTA: 1. 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 2. 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 3. Approximately 50% stenosis of the proximal right ICA. 4.  Aortic Atherosclerosis (ICD10-I70.0). CT perfusion: No evidence of core infarct or penumbra. Preliminary results were communicated on 02/11/2023 at 2:26 pm to provider Dr. Matthews via secure text paging. Electronically Signed   By: Gilmore GORMAN Molt M.D.   On: 02/11/2023 14:27   CT HEAD CODE STROKE WO CONTRAST Result Date: 02/11/2023 CLINICAL DATA:  Code stroke. Provided history: Neuro deficit, acute, stroke suspected. EXAM: CT HEAD WITHOUT CONTRAST TECHNIQUE: Contiguous axial images were obtained from the base of the skull through the vertex without intravenous contrast. RADIATION DOSE REDUCTION: This exam was performed according to the departmental dose-optimization program which includes automated exposure control, adjustment of the mA and/or kV according to patient size and/or use of iterative reconstruction technique. COMPARISON:  None. FINDINGS: Brain: Generalized parenchymal atrophy. Commensurate prominence of the ventricles and sulci Age-indeterminate lacunar infarcts within the left centrum semiovale, left caudate nucleus and bilateral corona radiata. Moderate patchy and ill-defined hypoattenuation within the cerebral white matter, nonspecific but  compatible with chronic small vessel ischemic disease. There is no acute intracranial hemorrhage. No demarcated cortical infarct. No extra-axial fluid collection. No evidence of an intracranial mass. No midline shift. Vascular: No hyperdense vessel.  Atherosclerotic calcifications. Skull: No calvarial fracture or aggressive osseous lesion. Sinuses/Orbits: No mass or acute finding within the imaged orbits. No significant paranasal sinus disease. ASPECTS (Alberta Stroke Program Early CT Score) - Ganglionic level infarction (caudate, lentiform nuclei, internal capsule, insula, M1-M3 cortex): 6 - Supraganglionic infarction (M4-M6 cortex): 3 Total score (0-10 with 10 being normal): 9 These results were called by telephone at the time of interpretation on 02/11/2023 at 1:52 pm to provider BERNARDINO FIREMAN , who verbally acknowledged these results. IMPRESSION: 1. Age-indeterminate lacunar infarcts within the left centrum semiovale, left caudate nucleus and bilateral corona radiata. ASPECTS is 9. 2. Background parenchymal atrophy and chronic small vessel ischemic disease. Electronically Signed   By: Rockey Childs D.O.   On: 02/11/2023 13:54    Pending Labs Unresulted Labs (From admission, onward)     Start     Ordered   02/18/23 0500  Creatinine, serum  (enoxaparin  (LOVENOX )    CrCl >/= 30 ml/min)  Weekly,   R     Comments: while on enoxaparin  therapy    02/11/23 1612   02/12/23 0500  Lipid panel  (  Labs)  Tomorrow morning,   R       Comments: Fasting    02/11/23 1612   02/11/23 1611  Hemoglobin A1c  (Labs)  Add-on,   AD       Comments: To assess prior glycemic control    02/11/23 1612            Vitals/Pain Today's Vitals   02/11/23 1428 02/11/23 1430 02/11/23 1545 02/11/23 1600  BP: (!) 189/90 (!) 181/80 (!) 174/70 (!) 158/125  Pulse: 69 71 63 62  Resp: 15 13 15 16   Temp: 98.6 F (37 C)     TempSrc: Oral     SpO2: 97% 96% 94% 93%  Weight:      Height:      PainSc: 0-No pain       Isolation  Precautions No active isolations  Medications Medications   stroke: early stages of recovery book (has no administration in time range)  acetaminophen  (TYLENOL ) tablet 650 mg (has no administration in time range)    Or  acetaminophen  (TYLENOL ) 160 MG/5ML solution 650 mg (has no administration in time range)    Or  acetaminophen  (TYLENOL ) suppository 650 mg (has no administration in time range)  sodium chloride  flush (NS) 0.9 % injection 3-10 mL (has no administration in time range)  sodium chloride  flush (NS) 0.9 % injection 3-10 mL (has no administration in time range)  enoxaparin  (LOVENOX ) injection 40 mg (has no administration in time range)  aspirin  EC tablet 81 mg (has no administration in time range)  clopidogrel  (PLAVIX ) tablet 75 mg (has no administration in time range)  iohexol  (OMNIPAQUE ) 350 MG/ML injection 100 mL (100 mLs Intravenous Contrast Given 02/11/23 1354)    Mobility manual wheelchair       R Recommendations: See Admitting Provider Note  Report given to: [facility]

## 2023-02-11 NOTE — H&P (Signed)
 History and Physical    Francisco Reid FMW:[id number] DOB: 07-16-1940 DOA: 02/11/2023 PCP: Patient, No Pcp Per  Chief Complaint: R arm weakness Historian: patient  HPI:  Francisco Reid is a 83 y.o. male with a PMH significant for DM, chronic bradycardia, HLD, HTN, melanoma, CAD s/p PCI, dysphagia. At baseline, they live alone and are independent with ADLs.  They presented from home to the ED on 02/11/2023 with R arm weakness x1 days. He states that his last known normal was yesterday. He states that he woke up this morning with right arm pain. With further discussion, he describes the R arm as being unable to function but not painful. Has regained some movement of the arm but unable to lift it off the bed or make a fist. Has not had any injury, no prior similar episodes. Denies LE weakness, dysphagia or facial droop. He otherwise feels like himself.   In the ED, it was found that they had stable vital signs with exception of hypertension up to 180s systolics.  Significant findings included: unremarkable CBC, metabolic panel.   CT head: 1. Age-indeterminate lacunar infarcts within the left centrum semiovale, left caudate nucleus and bilateral corona radiata. 2. Background parenchymal atrophy and chronic small vessel ischemic disease.  CTA head and neck:  1. 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 2. 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 3. Approximately 50% stenosis of the proximal right ICA. 4.  Aortic Atherosclerosis (ICD10-I70.0).   CT perfusion: No evidence of core infarct or penumbra.  Brain MRI completed but not read.   They were initially treated with nothing. Tele neurology was consulted and evaluated.   Patient was admitted to medicine service for further workup and management of acute CVA as outlined in detail below.  Assessment/Plan Principal Problem:   CVA (cerebral vascular accident) (HCC) Active Problems:   Acute  CVA (cerebrovascular accident) (HCC)   Acute CVA- with resultant R arm weakness - follow MRI results - follow up neurology recommendations, pending - aspirin  plus plavix  plus statin - hgb A1c, lipid panel, TSH pending - PT/OT - permissive HTN - echo?  CAD s/p remote PCI  HLD- asymptomatic. Denies chest pain, SOB. ECG sinus rhythm, non-acute.  - asa, plavix , statin  HTN- poorly controlled. No home medications listed.  - titrate on home regimen after permissive HTN  H/o DM. Diet controlled.  - hgb A1c pending. Add on glycemic regimen PRN  Past Medical History:  Diagnosis Date   Cancer (HCC)    melanoma   Chronic sinus bradycardia    Coronary artery disease    Diabetes mellitus without complication (HCC)    History of dysphagia    Hypercholesterolemia    Hypertension    Shortness of breath dyspnea    Thyroid  nodule     Past Surgical History:  Procedure Laterality Date   c-5 neck surgery     COLON SURGERY     COLONOSCOPY WITH ESOPHAGOGASTRODUODENOSCOPY (EGD)     COLONOSCOPY WITH PROPOFOL  N/A 07/12/2015   Procedure: COLONOSCOPY WITH PROPOFOL ;  Surgeon: Gladis RAYMOND Mariner, MD;  Location: [facility] ENDOSCOPY;  Service: Endoscopy;  Laterality: N/A;   CORONARY ANGIOPLASTY WITH STENT PLACEMENT     ESOPHAGOGASTRODUODENOSCOPY (EGD) WITH PROPOFOL  N/A 07/12/2015   Procedure: ESOPHAGOGASTRODUODENOSCOPY (EGD) WITH PROPOFOL ;  Surgeon: Gladis RAYMOND Mariner, MD;  Location: [facility] ENDOSCOPY;  Service: Endoscopy;  Laterality: N/A;   HERNIA REPAIR       reports that he quit smoking about 30 years  ago. He has never used smokeless tobacco. He reports that he does not drink alcohol and does not use drugs.  Allergies  Allergen Reactions   Enalapril    Keflet [Cephalexin] Rash    History reviewed. No pertinent family history.  Prior to Admission medications   Medication Sig Start Date End Date Taking? Authorizing Provider  aspirin  EC 81 MG tablet Take 81 mg by mouth daily.    [provider]  atorvastatin  (LIPITOR) 40 MG tablet Take 1 tablet by mouth at bedtime.    [provider]  clopidogrel  (PLAVIX ) 75 MG tablet Take 75 mg by mouth daily.    [provider]  hydrALAZINE  (APRESOLINE ) 25 MG tablet Take 1 tablet (25 mg total) by mouth every 8 (eight) hours. Patient not taking: Reported on 02/24/2018 06/25/15   Konidena, Snehalatha, MD  hydrochlorothiazide (HYDRODIURIL) 12.5 MG tablet Take 12.5 mg by mouth daily. 12/11/17   [provider]  ibuprofen  (ADVIL ) 800 MG tablet Take 1 tablet (800 mg total) by mouth every 8 (eight) hours as needed for moderate pain. 01/04/21   Zammit, Joseph, MD  metFORMIN (GLUCOPHAGE) 1000 MG tablet Take 1,000 mg by mouth 2 (two) times daily with a meal. 12/11/17   [provider]  metFORMIN (GLUMETZA) 500 MG (MOD) 24 hr tablet Take 500 mg by mouth every evening.     [provider]  sulfamethoxazole-trimethoprim (BACTRIM DS,SEPTRA DS) 800-160 MG tablet Take 1 tablet by mouth 2 (two) times daily.    [provider]  tamsulosin  (FLOMAX ) 0.4 MG CAPS capsule Take 0.4 mg by mouth daily.    [provider]   I have personally, briefly reviewed patient's prior medical records in [facility]  Objective: Blood pressure (!) 189/90, pulse 69, temperature 98.6 F (37 C), temperature source Oral, resp. rate 15, height 6' (1.829 m), weight 95.3 kg, SpO2 97%.   Constitutional: NAD, calm, comfortable HEENT: lids and conjunctivae normal. MMM. Posterior pharynx clear of any exudate or lesions. Normal dentition.  Neck: normal, supple, no masses, no thyromegaly Respiratory: CTAB, no wheezing, no crackles. Normal respiratory effort. No accessory muscle use.  Cardiovascular: RRR, no murmurs / rubs / gallops. No extremity edema. 2+ pedal pulses. no clubbing / cyanosis.  Abdomen: soft, NT, ND, no masses or HSM palpated. Musculoskeletal: No joint deformity upper and lower extremities. Normal muscle  tone.  Skin: dry, intact, normal color, normal temperature on exposed skin Neurologic: Alert and oriented x 3. Dysarthria due to poor dentition- no asymmetry. R arm strength 2/5. LE normal and symmetrical. CN II-XII grossly intact Psychiatric: Normal mood. Congruent affect.  Labs on Admission: I have personally reviewed admission labs and imaging studies  CBC    Component Value Date/Time   WBC 8.0 02/11/2023 1340   RBC 5.41 02/11/2023 1340   HGB 16.3 02/11/2023 1347   HCT 48.0 02/11/2023 1347   PLT 129 (L) 02/11/2023 1340   MCV 89.1 02/11/2023 1340   MCH 30.3 02/11/2023 1340   MCHC 34.0 02/11/2023 1340   RDW 13.2 02/11/2023 1340   LYMPHSABS 1.5 02/11/2023 1340   MONOABS 0.6 02/11/2023 1340   EOSABS 0.1 02/11/2023 1340   BASOSABS 0.0 02/11/2023 1340   CMP     Component Value Date/Time   NA 141 02/11/2023 1347   NA 136 11/20/2012 0445   K 3.9 02/11/2023 1347   K 4.0 11/20/2012 0445   CL 102 02/11/2023 1347   CL 105 11/20/2012 0445   CO2 26 02/11/2023  1340   CO2 26 11/20/2012 0445   GLUCOSE 105 (H) 02/11/2023 1347   GLUCOSE 92 11/20/2012 0445   BUN 14 02/11/2023 1347   BUN 10 11/20/2012 0445   CREATININE 1.10 02/11/2023 1347   CREATININE 0.94 11/20/2012 0445   CALCIUM  8.9 02/11/2023 1340   CALCIUM  8.7 11/20/2012 0445   PROT 6.8 02/11/2023 1340   ALBUMIN 3.9 02/11/2023 1340   AST 21 02/11/2023 1340   ALT 17 02/11/2023 1340   ALKPHOS 104 02/11/2023 1340   BILITOT 0.8 02/11/2023 1340   GFRNONAA >60 02/11/2023 1340   GFRNONAA >60 11/20/2012 0445   GFRAA >60 07/02/2019 2028   GFRAA >60 11/20/2012 0445    Radiological Exams on Admission: MR BRAIN WO CONTRAST Result Date: 02/11/2023 CLINICAL DATA:  Stroke suspected EXAM: MRI HEAD WITHOUT CONTRAST TECHNIQUE: Multiplanar, multiecho pulse sequences of the brain and surrounding structures were obtained without intravenous contrast. COMPARISON:  02/11/2023 CT head and CTA head and neck, no prior MRI available FINDINGS:  Brain: Restricted diffusion with ADC correlates in the left frontal and parietal cortex (series 5, images 17, 27-33), right posterior basal ganglia/corona radiata (series 5, images 23-24), left posterior caudate (series 5, image 20), left occipital lobe (series 5, images 17-20), compatible with acute to early subacute infarcts. No acute hemorrhage, mass, mass effect, or midline shift. No hydrocephalus or extra-axial collection. Pituitary and craniocervical junction within normal limits. Hemosiderin deposition is associated with some of the suspected acute infarcts (series 13, image 50 and 54), which may indicate petechial hemorrhage. 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 Advanced cerebral volume loss for age, most prominent in left greater than right frontal parietal lobes. Confluent T2 hyperintense signal in the periventricular white matter, likely the sequela of severe chronic small vessel ischemic disease. Vascular: Loss of the left vertebral artery flow void (series 10, image 1), which correlates with the occlusion seen on the same-day CTA. Otherwise normal arterial flow voids. Skull and upper cervical spine: Normal marrow signal. Sinuses/Orbits: Clear paranasal sinuses. No acute finding in the orbits. Right lens replacement. Other: The mastoid air cells are well aerated. IMPRESSION: 1. Acute to early subacute infarcts in the left frontal and parietal cortex, right posterior basal ganglia/corona radiata, left posterior caudate, and left occipital lobe. Hemosiderin deposition is associated with some of these infarcts, which may indicate petechial hemorrhage. 2. Loss of the left vertebral artery flow void, which correlates with the occlusion seen on the same-day CTA These results were called by telephone at the time of interpretation on 02/11/2023 at 6:19 pm to provider STACK, who verbally acknowledged  these results. Electronically Signed   By: Donald Campion M.D.   On: 02/11/2023 18:20   CT ANGIO HEAD NECK W WO CM W PERF (CODE STROKE) Result Date: 02/11/2023 CLINICAL DATA:  Neuro deficit, acute, stroke suspected RUE flaccid paralysis EXAM: CT ANGIOGRAPHY HEAD AND NECK CT PERFUSION BRAIN TECHNIQUE: Multidetector CT imaging of the head and neck was performed using the standard protocol during bolus administration of intravenous contrast. Multiplanar CT image reconstructions and MIPs were obtained to evaluate the vascular anatomy. Carotid stenosis measurements (when applicable) are obtained utilizing NASCET criteria, using the distal internal carotid diameter as the denominator. Multiphase CT imaging of the brain was performed following IV bolus contrast injection. Subsequent parametric perfusion maps were calculated using RAPID software. RADIATION DOSE REDUCTION: This exam was performed according to the departmental dose-optimization program which includes  automated exposure control, adjustment of the mA and/or kV according to patient size and/or use of iterative reconstruction technique. CONTRAST:  OMNIPAQUE  IOHEXOL  350 MG/ML SOLN COMPARISON:  None Available. FINDINGS: CTA NECK FINDINGS Aortic arch: Aortic atherosclerosis. Great vessel origins are patent without significant stenosis. Right carotid system: Atherosclerosis at the carotid bifurcation and involving the proximal ICA with proximally 50% stenosis of the proximal ICA. Left carotid system: Atherosclerosis at the carotid bifurcation and involving the proximal ICA without greater than 50% stenosis. Vertebral arteries: Right dominant. No evidence of dissection, stenosis (50% or greater) or occlusion in the neck. Skeleton: Other neck: Upper chest: Review of the MIP images confirms the above findings CTA HEAD FINDINGS Anterior circulation: Bilateral intracranial ICAs are patent with moderate left and mild right cavernous ICA narrowing. Bilateral MCAs  and ACAs are patent without proximal hemodynamically significant stenosis. 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 Right intradural vertebral artery is patent with mild stenosis at its dural margin. Basilar artery and bilateral posterior cerebral arteries are patent without proximal hemodynamically significant stenosis. Venous sinuses: As permitted by contrast timing, patent. Review of the MIP images confirms the above findings CT Brain Perfusion Findings: ASPECTS: 9 CBF (<30%) Volume: 0mL Perfusion (Tmax>6.0s) volume: 0mL Mismatch Volume: 0mL Infarction Location:None identified. IMPRESSION: CTA: 1. 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 2. 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 3. Approximately 50% stenosis of the proximal right ICA. 4.  Aortic Atherosclerosis (ICD10-I70.0). CT perfusion: No evidence of core infarct or penumbra. Preliminary results were communicated on 02/11/2023 at 2:26 pm to provider Dr. Matthews via secure text paging. Electronically Signed   By: Gilmore GORMAN Molt M.D.   On: 02/11/2023 14:27   CT HEAD CODE STROKE WO CONTRAST Result Date: 02/11/2023 CLINICAL DATA:  Code stroke. Provided history: Neuro deficit, acute, stroke suspected. EXAM: CT HEAD WITHOUT CONTRAST TECHNIQUE: Contiguous axial images were obtained from the base of the skull through the vertex without intravenous contrast. RADIATION DOSE REDUCTION: This exam was performed according to the departmental dose-optimization program which includes automated exposure control, adjustment of the mA and/or kV according to patient size and/or use of iterative reconstruction technique. COMPARISON:  None. FINDINGS: Brain: Generalized parenchymal atrophy. Commensurate prominence of the ventricles and sulci Age-indeterminate lacunar infarcts within the left centrum semiovale, left caudate nucleus and bilateral corona radiata. Moderate patchy and  ill-defined hypoattenuation within the cerebral white matter, nonspecific but compatible with chronic small vessel ischemic disease. There is no acute intracranial hemorrhage. No demarcated cortical infarct. No extra-axial fluid collection. No evidence of an intracranial mass. No midline shift. Vascular: No hyperdense vessel.  Atherosclerotic calcifications. Skull: No calvarial fracture or aggressive osseous lesion. Sinuses/Orbits: No mass or acute finding within the imaged orbits. No significant paranasal sinus disease. ASPECTS (Alberta Stroke Program Early CT Score) - Ganglionic level infarction (caudate, lentiform nuclei, internal capsule, insula, M1-M3 cortex): 6 - Supraganglionic infarction (M4-M6 cortex): 3 Total score (0-10 with 10 being normal): 9 These results were called by telephone at the time of interpretation on 02/11/2023 at 1:52 pm to provider BERNARDINO FIREMAN , who verbally acknowledged these results. IMPRESSION: 1. Age-indeterminate lacunar infarcts within the left centrum semiovale, left caudate nucleus and bilateral corona radiata. ASPECTS is 9. 2. Background parenchymal atrophy and chronic small vessel ischemic disease. Electronically Signed   By: Rockey Childs D.O.   On: 02/11/2023 13:54    EKG: Independently reviewed. NSR  DVT prophylaxis: enoxaparin  (LOVENOX ) injection 40 mg  Start: 02/11/23 1800   Code Status: full. Discussed with patient Family Communication: none at bedside. He states there is no one he wants me to reach out to to update Disposition Plan: admit to med-surg  Consults called: neurology    Marien LITTIE Piety, DO [facility]  02/11/2023, 8:13 PM    To contact the appropriate [facility] Attending or Consulting provider: Check [email or web address] for coverage from 7pm-7am

## 2023-02-11 NOTE — ED Triage Notes (Signed)
 Pt c/o rt arm and leg weakness. Pt and family states that it started after waking up this morning. Per family pt able to ambulate but unable to move rt arm.

## 2023-02-11 NOTE — Consult Note (Signed)
 NEUROLOGY TELECONSULTATION NOTE   Date of service: February 11, 2023 Patient Name: Francisco Reid MRN:  [id number] DOB:  Aug 08, 1940 Reason for consult: R sided weakness  Requesting Provider: Dr. Bernardino Fireman Consult Participants: myself, patient, bedside RN, telestroke RN Location of the provider: [facility] Location of the patient: [facility]  This consult was provided via telemedicine with 2-way video and audio communication. The patient/family was informed that care would be provided in this way and agreed to receive care in this manner.   _ _ _   _ __   _ __ _ _  __ __   _ __   __ _  History of Present Illness   83 yo man with hx melanoma, CAD, DM2, HL, HTN who presented via private vehicle for R sided weakness. Patient also altered on presentation. LKW 2200. NIHSS = 7. Head CT showed multiple age-indeterminate lacunar strokes with ASPECTS 9. Patient was not a TNK candidate secondary to presentation outside the window.  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  The artery was hypoplastic and its occlusion was not felt to be causing the patient's presentation.  Therefore intervention was not recommended.   ROS   UTA 2/2 AMS  Past History   The following was personally reviewed:  Past Medical History:  Diagnosis Date   Cancer (HCC)    melanoma   Chronic sinus bradycardia    Coronary artery disease    Diabetes mellitus without complication (HCC)    History of dysphagia    Hypercholesterolemia    Hypertension    Shortness of breath dyspnea    Thyroid  nodule    Past Surgical History:  Procedure Laterality Date   c-5 neck surgery     COLON SURGERY     COLONOSCOPY WITH ESOPHAGOGASTRODUODENOSCOPY (EGD)     COLONOSCOPY WITH PROPOFOL  N/A 07/12/2015   Procedure: COLONOSCOPY WITH PROPOFOL ;  Surgeon: Francisco RAYMOND Mariner, MD;  Location: [facility] ENDOSCOPY;  Service: Endoscopy;  Laterality: N/A;   CORONARY ANGIOPLASTY WITH STENT PLACEMENT      ESOPHAGOGASTRODUODENOSCOPY (EGD) WITH PROPOFOL  N/A 07/12/2015   Procedure: ESOPHAGOGASTRODUODENOSCOPY (EGD) WITH PROPOFOL ;  Surgeon: Francisco RAYMOND Mariner, MD;  Location: [facility] ENDOSCOPY;  Service: Endoscopy;  Laterality: N/A;   HERNIA REPAIR     History reviewed. No pertinent family history. Social History   Socioeconomic History   Marital status: Single    Spouse name: Not on file   Number of children: Not on file   Years of education: Not on file   Highest education level: Not on file  Occupational History   Not on file  Tobacco Use   Smoking status: Former    Current packs/day: 0.00    Types: Cigarettes    Quit date: 09/01/1992    Years since quitting: 30.4   Smokeless tobacco: Never  Substance and Sexual Activity   Alcohol use: No   Drug use: No   Sexual activity: Not on file  Other Topics Concern   Not on file  Social History Narrative   Not on file   Social Drivers of Health   Financial Resource Strain: Not on file  Food Insecurity: No Food Insecurity (02/11/2023)   Hunger Vital Sign    Worried About Running Out of Food in the Last Year: Never true    Ran Out of Food in the Last Year: Never true  Transportation Needs: No Transportation Needs (02/11/2023)   PRAPARE - Administrator, Civil Service (Medical): No  Lack of Transportation (Non-Medical): No  Physical Activity: Not on file  Stress: Not on file  Social Connections: Patient Declined (02/11/2023)   Social Connection and Isolation Panel [NHANES]    Frequency of Communication with Friends and Family: Patient declined    Frequency of Social Gatherings with Friends and Family: Patient declined    Attends Religious Services: Patient declined    Database Administrator or Organizations: Patient declined    Attends Banker Meetings: Patient declined    Marital Status: Patient declined   Allergies  Allergen Reactions   Enalapril    Keflet [Cephalexin] Rash    Medications   No medications  prior to admission.      Current Facility-Administered Medications:    [START ON 02/12/2023]  stroke: early stages of recovery book, , Does not apply, Once, Francisco Marien CROME, MD   acetaminophen  (TYLENOL ) tablet 650 mg, 650 mg, Oral, Q4H PRN **OR** acetaminophen  (TYLENOL ) 160 MG/5ML solution 650 mg, 650 mg, Per Tube, Q4H PRN **OR** acetaminophen  (TYLENOL ) suppository 650 mg, 650 mg, Rectal, Q4H PRN, Francisco Marien CROME, MD   aspirin  EC tablet 81 mg, 81 mg, Oral, Daily, Francisco Marien L, MD, 81 mg at 02/11/23 1719   [START ON 02/12/2023] atorvastatin  (LIPITOR) tablet 40 mg, 40 mg, Oral, Daily, Francisco Marien L, MD   clopidogrel  (PLAVIX ) tablet 75 mg, 75 mg, Oral, Daily, Francisco Marien L, MD, 75 mg at 02/11/23 1720   enoxaparin  (LOVENOX ) injection 40 mg, 40 mg, Subcutaneous, Q24H, Francisco Marien L, MD, 40 mg at 02/11/23 1721   sodium chloride  flush (NS) 0.9 % injection 3-10 mL, 3-10 mL, Intravenous, Q12H, Francisco, Chelsey L, MD   sodium chloride  flush (NS) 0.9 % injection 3-10 mL, 3-10 mL, Intravenous, PRN, Francisco Marien CROME, MD  Vitals   Vitals:   02/11/23 1430 02/11/23 1545 02/11/23 1600 02/11/23 1758  BP: (!) 181/80 (!) 174/70 (!) 158/125 (!) 162/77  Pulse: 71 63 62 64  Resp: 13 15 16 15   Temp:    98.9 F (37.2 C)  TempSrc:    Oral  SpO2: 96% 94% 93% 97%  Weight:      Height:         Body mass index is 28.49 kg/m.  Physical Exam   Exam performed over telemedicine with 2-way video and audio communication and with assistance of bedside RN  Physical Exam Gen: Alert, NAD Resp: normal WOB CV: extremities appear well-perfused  Neuro: *MS: Alert, oriented to self only, follows occasional simple commands with significant prompting *Speech: mild dysarthria, no aphasia *CN: PERRL 3mm, EOMI, VFF by confrontation, sensation intact, smile symmetric, hearing intact to voice *Motor:   Normal bulk.  No tremor, rigidity or bradykinesia. No pronator drift. RLE drift, RLE  drift to bed. LUE and LLE no drift. *Sensory: SILT. Symmetric. No double-simultaneous extinction.  *Coordination:  UTA 2/2 mental status *Reflexes:  UTA 2/2 tele-exam *Gait: deferred  NIHSS  1a Level of Conscious.: 0 1b LOC Questions: 2 1c LOC Commands: 1 2 Best Gaze: 0 3 Visual: 0 4 Facial Palsy: 0 5a Motor Arm - left: 0 5b Motor Arm - Right: 2 6a Motor Leg - Left: 0 6b Motor Leg - Right: 1 7 Limb Ataxia: 0 8 Sensory: 0 9 Best Language: 0 10 Dysarthria: 1 11 Extinct. and Inatten.: 0  TOTAL: 7   Premorbid mRS = 2   Labs   CBC:  Recent Labs  Lab 02/11/23 1340 02/11/23 1347  WBC 8.0  --  NEUTROABS 5.7  --   HGB 16.4 16.3  HCT 48.2 48.0  MCV 89.1  --   PLT 129*  --     Basic Metabolic Panel:  Lab Results  Component Value Date   NA 141 02/11/2023   K 3.9 02/11/2023   CO2 26 02/11/2023   GLUCOSE 105 (H) 02/11/2023   BUN 14 02/11/2023   CREATININE 1.10 02/11/2023   CALCIUM  8.9 02/11/2023   GFRNONAA >60 02/11/2023   GFRAA >60 07/02/2019   Lipid Panel: No results found for: LDLCALC HgbA1c: No results found for: HGBA1C Urine Drug Screen:     Component Value Date/Time   LABOPIA NONE DETECTED 02/11/2023 1438   COCAINSCRNUR NONE DETECTED 02/11/2023 1438   LABBENZ NONE DETECTED 02/11/2023 1438   AMPHETMU NONE DETECTED 02/11/2023 1438   THCU NONE DETECTED 02/11/2023 1438   LABBARB NONE DETECTED 02/11/2023 1438    Alcohol Level     Component Value Date/Time   ETH <10 02/11/2023 1340    CT Head without contrast: 1. Age-indeterminate lacunar infarcts within the left centrum semiovale, left caudate nucleus and bilateral corona radiata. ASPECTS is 9. 2. Background parenchymal atrophy and chronic small vessel ischemic disease.  CTA H&N   1. 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 2. 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 3. Approximately 50% stenosis of the proximal right ICA. 4.  Aortic  Atherosclerosis (ICD10-I70.0).   CTP   No evidence of core infarct or penumbra.  All CNS imaging personally reviewed  Impression   83 yo man with hx melanoma, CAD, DM2, HL, HTN who presented via private vehicle for R sided weakness. Patient also altered on presentation. LKW 2200. NIHSS = 7. Head CT showed multiple age-indeterminate lacunar strokes with ASPECTS 9. Patient was not a TNK candidate secondary to presentation outside the window.  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  The artery was hypoplastic and its occlusion was not felt to be causing the patient's presentation.  Therefore intervention was not recommended.  Recommendations   - Admit for stroke workup - Permissive HTN x48 hrs from sx onset or until stroke ruled out by MRI goal BP <220/120. PRN labetalol or hydralazine  if BP above these parameters. Avoid oral antihypertensives. - MRI brain wo contrast - TTE - Check A1c and LDL + add statin per guidelines - ASA 81mg  daily + plavix  75mg  daily x90 days f/b ASA 81mg  daily monotherapy after that - q4 hr neuro checks - STAT head CT for any change in neuro exam - Tele - PT/OT/SLP - NPO until RN dysphagia screen - Stroke education - Amb referral to neurology upon discharge   ______________________________________________________________________   Thank you for the opportunity to take part in the care of this patient. If you have any further questions, please contact the neurology consultation attending.  Signed,  Elida Ross, MD [facility] [phone]  If 7pm- 7am, please page neurology on call as listed in AMION.  **Any copied and pasted documentation in this note was written by me in another application not billed for and pasted by me into this document.

## 2023-02-12 ENCOUNTER — Observation Stay (HOSPITAL_COMMUNITY): Payer: Medicare Other

## 2023-02-12 DIAGNOSIS — Z87891 Personal history of nicotine dependence: Secondary | ICD-10-CM | POA: Diagnosis not present

## 2023-02-12 DIAGNOSIS — I5030 Unspecified diastolic (congestive) heart failure: Secondary | ICD-10-CM

## 2023-02-12 DIAGNOSIS — G8101 Flaccid hemiplegia affecting right dominant side: Secondary | ICD-10-CM | POA: Diagnosis present

## 2023-02-12 DIAGNOSIS — Z8673 Personal history of transient ischemic attack (TIA), and cerebral infarction without residual deficits: Secondary | ICD-10-CM | POA: Diagnosis not present

## 2023-02-12 DIAGNOSIS — I2510 Atherosclerotic heart disease of native coronary artery without angina pectoris: Secondary | ICD-10-CM | POA: Diagnosis present

## 2023-02-12 DIAGNOSIS — F05 Delirium due to known physiological condition: Secondary | ICD-10-CM | POA: Diagnosis present

## 2023-02-12 DIAGNOSIS — G40909 Epilepsy, unspecified, not intractable, without status epilepticus: Secondary | ICD-10-CM | POA: Diagnosis present

## 2023-02-12 DIAGNOSIS — E7800 Pure hypercholesterolemia, unspecified: Secondary | ICD-10-CM | POA: Diagnosis present

## 2023-02-12 DIAGNOSIS — R4189 Other symptoms and signs involving cognitive functions and awareness: Secondary | ICD-10-CM | POA: Diagnosis present

## 2023-02-12 DIAGNOSIS — Z7982 Long term (current) use of aspirin: Secondary | ICD-10-CM | POA: Diagnosis not present

## 2023-02-12 DIAGNOSIS — Z955 Presence of coronary angioplasty implant and graft: Secondary | ICD-10-CM | POA: Diagnosis not present

## 2023-02-12 DIAGNOSIS — Z7902 Long term (current) use of antithrombotics/antiplatelets: Secondary | ICD-10-CM | POA: Diagnosis not present

## 2023-02-12 DIAGNOSIS — Z888 Allergy status to other drugs, medicaments and biological substances status: Secondary | ICD-10-CM | POA: Diagnosis not present

## 2023-02-12 DIAGNOSIS — I63412 Cerebral infarction due to embolism of left middle cerebral artery: Secondary | ICD-10-CM | POA: Diagnosis present

## 2023-02-12 DIAGNOSIS — E119 Type 2 diabetes mellitus without complications: Secondary | ICD-10-CM | POA: Diagnosis present

## 2023-02-12 DIAGNOSIS — Z881 Allergy status to other antibiotic agents status: Secondary | ICD-10-CM | POA: Diagnosis not present

## 2023-02-12 DIAGNOSIS — Z85820 Personal history of malignant melanoma of skin: Secondary | ICD-10-CM | POA: Diagnosis not present

## 2023-02-12 DIAGNOSIS — R29707 NIHSS score 7: Secondary | ICD-10-CM | POA: Diagnosis present

## 2023-02-12 DIAGNOSIS — Z79899 Other long term (current) drug therapy: Secondary | ICD-10-CM | POA: Diagnosis not present

## 2023-02-12 DIAGNOSIS — I110 Hypertensive heart disease with heart failure: Secondary | ICD-10-CM | POA: Diagnosis present

## 2023-02-12 DIAGNOSIS — I5022 Chronic systolic (congestive) heart failure: Secondary | ICD-10-CM | POA: Diagnosis present

## 2023-02-12 DIAGNOSIS — I639 Cerebral infarction, unspecified: Secondary | ICD-10-CM | POA: Diagnosis present

## 2023-02-12 DIAGNOSIS — Z7984 Long term (current) use of oral hypoglycemic drugs: Secondary | ICD-10-CM | POA: Diagnosis not present

## 2023-02-12 LAB — LIPID PANEL
Cholesterol: 197 mg/dL (ref 0–200)
HDL: 42 mg/dL (ref >40)
LDL Cholesterol: 142 mg/dL — ABNORMAL HIGH (ref 0–99)
Total CHOL/HDL Ratio: 4.7 {ratio}
Triglycerides: 64 mg/dL (ref <150)
VLDL: 13 mg/dL (ref 0–40)

## 2023-02-12 LAB — ECHOCARDIOGRAM COMPLETE
Area-P 1/2: 3.66 cm2
Height: 72 in
S' Lateral: 5.1 cm
Weight: 3361.57 [oz_av]

## 2023-02-12 LAB — TSH: TSH: 0.821 u[IU]/mL (ref 0.350–4.500)

## 2023-02-12 MED ORDER — CLOPIDOGREL BISULFATE 75 MG PO TABS
75.0000 mg | ORAL_TABLET | Freq: Every day | ORAL | Status: DC
Start: 2023-02-12 — End: 2023-02-14
  Administered 2023-02-12 – 2023-02-14 (×3): 75 mg via ORAL
  Filled 2023-02-12 (×3): qty 1

## 2023-02-12 MED ORDER — ASPIRIN 81 MG PO TBEC
81.0000 mg | DELAYED_RELEASE_TABLET | Freq: Every day | ORAL | Status: DC
  Administered 2023-02-12 – 2023-02-14 (×3): 81 mg via ORAL
  Filled 2023-02-12 (×3): qty 1

## 2023-02-12 NOTE — Evaluation (Signed)
 Occupational Therapy Evaluation Patient Details Name: Francisco Reid MRN: [id number] DOB: 01-03-1941 Today's Date: 02/12/2023   History of Present Illness Francisco Reid is a 83 y.o. male with a PMH significant for DM, chronic bradycardia, HLD, HTN, melanoma, CAD s/p PCI, dysphagia.  At baseline, they live alone and are independent with ADLs.     They presented from home to the ED on 02/11/2023 with R arm weakness x1 days. He states that his last known normal was yesterday. He states that he woke up this morning with right arm pain. With further discussion, he describes the R arm as being unable to function but not painful. Has regained some movement of the arm but unable to lift it off the bed or make a fist. Has not had any injury, no prior similar episodes. Denies LE weakness, dysphagia or facial droop. He otherwise feels like himself. (per MD)   Clinical Impression   Pt agreeable to OT and PT co-evaluation. Pt reports independence at baseline. Today pt required mod A for transfer to the chair and ambulation with the RW. Weak R UE with limited functional use and A/ROM. Unable to maintain grasp on RW without assist. Moderate assist for donning and doffing socks based on brief attempt. Pt was left in the chair with tray in front and call bell within reach. Pt will benefit from continued OT in the hospital and recommended venue below to increase strength, balance, and endurance for safe ADL's.          If plan is discharge home, recommend the following: A lot of help with walking and/or transfers;A lot of help with bathing/dressing/bathroom;Assistance with cooking/housework;Assist for transportation;Help with stairs or ramp for entrance    Functional Status Assessment  Patient has had a recent decline in their functional status and demonstrates the ability to make significant improvements in function in a reasonable and predictable amount of time.  Equipment Recommendations  None recommended by  OT           Precautions / Restrictions Precautions Precautions: Fall Restrictions Weight Bearing Restrictions Per Provider Order: No      Mobility Bed Mobility               General bed mobility comments: Pt seated upright in bed when this OT arrived.    Transfers Overall transfer level: Needs assistance Equipment used: Rolling walker (2 wheels) Transfers: Bed to chair/wheelchair/BSC, Sit to/from Stand Sit to Stand: Mod assist     Step pivot transfers: Mod assist     General transfer comment: Very unsteady; slow labored movement. Seeking hand held support if not using RW.      Balance Overall balance assessment: Needs assistance Sitting-balance support: No upper extremity supported, Feet supported Sitting balance-Leahy Scale: Fair Sitting balance - Comments: fair to good seated at EOB   Standing balance support: During functional activity, Bilateral upper extremity supported, Reliant on assistive device for balance Standing balance-Leahy Scale: Poor Standing balance comment: using RW and without; worse without RW                           ADL either performed or assessed with clinical judgement   ADL Overall ADL's : Needs assistance/impaired Eating/Feeding: Minimal assistance;Sitting   Grooming: Minimal assistance;Moderate assistance;Sitting   Upper Body Bathing: Moderate assistance;Sitting   Lower Body Bathing: Maximal assistance;Moderate assistance;Sitting/lateral leans   Upper Body Dressing : Moderate assistance;Sitting   Lower Body Dressing: Moderate assistance;Maximal assistance;Sitting/lateral leans  Toilet Transfer: Moderate assistance;Stand-pivot;Ambulation;Rolling walker (2 wheels) Toilet Transfer Details (indicate cue type and reason): Simulated via EOB to chair transfer without RW and ambulation in hall with RW. Toileting- Clothing Manipulation and Hygiene: Moderate assistance;Maximal assistance;Sitting/lateral lean   Tub/  Shower Transfer: Moderate assistance;Stand-pivot   Functional mobility during ADLs: Moderate assistance;Rolling walker (2 wheels) General ADL Comments: Able to ambulate in the hall with assist to manage RW and maintain grasp on R side of RW.     Vision Baseline Vision/History: 0 No visual deficits Ability to See in Adequate Light: 0 Adequate Patient Visual Report: No change from baseline Vision Assessment?: Yes Tracking/Visual Pursuits: Other (comment) (Difficulty tracking in L lower quadrant.)     Perception Perception: Not tested       Praxis Praxis: Not tested       Pertinent Vitals/Pain Pain Assessment Pain Assessment: Faces Faces Pain Scale: No hurt     Extremity/Trunk Assessment Upper Extremity Assessment Upper Extremity Assessment: Right hand dominant;RUE deficits/detail RUE Deficits / Details: 2+/5 shoulder flexion; 3+/5 elbow flexion and extension; 3-/5 wrist flexion and extenstion; 3-/5 grip strenth. Difficulty holding RW. RUE Sensation: decreased light touch RUE Coordination: decreased fine motor;decreased gross motor   Lower Extremity Assessment Lower Extremity Assessment: Defer to PT evaluation   Cervical / Trunk Assessment Cervical / Trunk Assessment: Normal   Communication Communication Communication: No apparent difficulties   Cognition Arousal: Alert Behavior During Therapy: WFL for tasks assessed/performed Overall Cognitive Status: Within Functional Limits for tasks assessed                                                        Home Living Family/patient expects to be discharged to:: Private residence Living Arrangements: Alone;Other (Comment) (reportedly has caregivers and nephew checks in on him.) Available Help at Discharge: Family;Personal care attendant Type of Home: House       Home Layout: One level     Bathroom Shower/Tub: Producer, Television/film/video: Standard                Prior  Functioning/Environment Prior Level of Function : Independent/Modified Independent             Mobility Comments: Tourist information centre manager without AD ADLs Comments: Independent per pt.        OT Problem List: Decreased strength;Decreased range of motion;Decreased activity tolerance;Impaired balance (sitting and/or standing);Impaired vision/perception;Decreased coordination;Impaired sensation      OT Treatment/Interventions: Self-care/ADL training;Therapeutic exercise;Therapeutic activities;Neuromuscular education;Patient/family education    OT Goals(Current goals can be found in the care plan section) Acute Rehab OT Goals Patient Stated Goal: improve function OT Goal Formulation: With patient Time For Goal Achievement: 02/26/23 Potential to Achieve Goals: Good  OT Frequency: Min 2X/week    Co-evaluation PT/OT/SLP Co-Evaluation/Treatment: Yes Reason for Co-Treatment: To address functional/ADL transfers   OT goals addressed during session: ADL's and self-care      AM-PAC OT 6 Clicks Daily Activity     Outcome Measure Help from another person eating meals?: A Little Help from another person taking care of personal grooming?: A Little Help from another person toileting, which includes using toliet, bedpan, or urinal?: A Lot Help from another person bathing (including washing, rinsing, drying)?: A Lot Help from another person to put on and taking off regular upper body clothing?: A Little Help from  another person to put on and taking off regular lower body clothing?: A Lot 6 Click Score: 15   End of Session Equipment Utilized During Treatment: Rolling walker (2 wheels);Gait belt  Activity Tolerance: Patient tolerated treatment well Patient left: in chair;with call bell/phone within reach  OT Visit Diagnosis: Unsteadiness on feet (R26.81);Other abnormalities of gait and mobility (R26.89);Muscle weakness (generalized) (M62.81);Other symptoms and signs involving the nervous system  (R29.898);Hemiplegia and hemiparesis Hemiplegia - Right/Left: Right Hemiplegia - dominant/non-dominant: Dominant Hemiplegia - caused by: Cerebral infarction                Time: 9079-9065 OT Time Calculation (min): 14 min Charges:  OT General Charges $OT Visit: 1 Visit OT Evaluation $OT Eval Low Complexity: 1 Low  Aser Nylund OT, MOT   Jayson Person 02/12/2023, 10:46 AM

## 2023-02-12 NOTE — Progress Notes (Signed)
 I connected with  Francisco Reid on 02/12/23 by a video enabled telemedicine application and verified that I am speaking with the correct person using two identifiers.   I discussed the limitations of evaluation and management by telemedicine. The patient expressed understanding and agreed to proceed.  Location of patient: [facility] Location of physician: [facility]  Subjective: No acute events overnight.  ROS: negative except above  Examination  Vital signs in last 24 hours: Temp:  [97.7 F (36.5 C)-98.9 F (37.2 C)] 98.4 F (36.9 C) (01/14 0857) Pulse Rate:  [60-77] 60 (01/14 0857) Resp:  [13-18] 16 (01/14 0538) BP: (124-189)/(59-125) 141/62 (01/14 0857) SpO2:  [93 %-100 %] 98 % (01/14 0857) Weight:  [95.3 kg] 95.3 kg (01/13 1317)  General: lying in bed, NAD Neuro: AOx3, cranial nerves appear grossly intact, antigravity strength in all 4 extremities with drift in right upper and right lower extremity, decree sensation to touch in right upper and right lower extremity, FTN intact bilaterally   Basic Metabolic Panel: Recent Labs  Lab 02/11/23 1340 02/11/23 1347  NA 136 141  K 3.8 3.9  CL 103 102  CO2 26  --   GLUCOSE 108* 105*  BUN 14 14  CREATININE 1.02 1.10  CALCIUM  8.9  --     CBC: Recent Labs  Lab 02/11/23 1340 02/11/23 1347  WBC 8.0  --   NEUTROABS 5.7  --   HGB 16.4 16.3  HCT 48.2 48.0  MCV 89.1  --   PLT 129*  --      Coagulation Studies: Recent Labs    02/11/23 1340  LABPROT 14.0  INR 1.1    Imaging personally reviewed  CT head without contrast 02/11/2023:  CTA head and neck with and without contrast 02/11/2023:  MRI brain without contrast 02/11/2023:        ASSESSMENT AND PLAN: 83 yo man with hx melanoma, CAD, DM2, HL, HTN who presented via private vehicle for R sided weakness. Patient also altered on presentation. LKW 2200. NIHSS = 7. Head CT showed multiple age-indeterminate lacunar strokes with ASPECTS 9.  Patient was not a TNK candidate secondary to presentation outside the window.  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  The artery was hypoplastic and its occlusion was not felt to be causing the patient's presentation.  Therefore intervention was not recommended.    Acute ischemic stroke -Etiology: Likely cardioembolic -EKG yesterday showed sinus rhythm.  Previous EKG on file in 2014 also did not show any evidence of atrial fibrillation.  Recommendations -TTE ordered and pending.  If no evidence of thrombus, recommend 30-day cardiac monitor to look for paroxysmal A-fib. -Recommend DAPT with aspirin  81 mg daily and Plavix  75 mg daily for 3 months followed by monotherapy with Plavix  75 mg daily (was on aspirin  81 mg prior to stroke) -LDL 142, recommend atorvastatin  40 mg daily -PT/OT -Stroke education -Modification of stroke risk factors -Follow-up with neurology in 3 months (order placed) -Discussed plan with Dr. Maree via secure chat  I have spent a total of  36  minutes with the patient reviewing hospital notes,  test results, labs and examining the patient as well as establishing an assessment and plan that was discussed personally with the patient.  > 50% of time was spent in direct patient care.      Arlin Krebs Epilepsy [facility] For questions after 5pm please refer to AMION to reach the Neurologist on call

## 2023-02-12 NOTE — Progress Notes (Signed)
 Mobility Specialist Progress Note:    02/12/23 1045  Mobility  Activity Transferred to/from BSC;Stood at bedside  Level of Assistance Moderate assist, patient does 50-74%  Assistive Device Front wheel walker  Distance Ambulated (ft) 4 ft  Range of Motion/Exercises Active;Left arm;Right leg;Left leg;Active Assistive;Right arm  Activity Response Tolerated well  Mobility Referral Yes  Mobility visit 1 Mobility  Mobility Specialist Start Time (ACUTE ONLY) 1035  Mobility Specialist Stop Time (ACUTE ONLY) 1045  Mobility Specialist Time Calculation (min) (ACUTE ONLY) 10 min   Pt received needing assistance to stand for peri care and transfer to chair. Required ModA to stand and transfer with RW. Tolerated well, RUE weakness. Left pt in chair, nurse in room. All needs met.   Sherrilee Ditty Mobility Specialist Please contact via Special Educational Needs Teacher or  Rehab office at [phone]

## 2023-02-12 NOTE — Plan of Care (Signed)
  Problem: Acute Rehab OT Goals (only OT should resolve) Goal: Pt. Will Perform Eating Flowsheets (Taken 02/12/2023 1048) Pt Will Perform Eating: with modified independence Goal: Pt. Will Perform Grooming Flowsheets (Taken 02/12/2023 1048) Pt Will Perform Grooming: with modified independence Goal: Pt. Will Perform Upper Body Dressing Flowsheets (Taken 02/12/2023 1048) Pt Will Perform Upper Body Dressing: with modified independence Goal: Pt. Will Perform Lower Body Dressing Flowsheets (Taken 02/12/2023 1048) Pt Will Perform Lower Body Dressing: with modified independence Goal: Pt. Will Transfer To Toilet Flowsheets (Taken 02/12/2023 1048) Pt Will Transfer to Toilet: with supervision Goal: Pt. Will Perform Toileting-Clothing Manipulation Flowsheets (Taken 02/12/2023 1048) Pt Will Perform Toileting - Clothing Manipulation and hygiene: with modified independence Goal: Pt/Caregiver Will Perform Home Exercise Program Flowsheets (Taken 02/12/2023 1048) Pt/caregiver will Perform Home Exercise Program:  Increased ROM  Increased strength  Right Upper extremity  Independently  Hiromi Knodel OT, MOT

## 2023-02-12 NOTE — Evaluation (Signed)
 Physical Therapy Evaluation Patient Details Name: Francisco Reid MRN: [id number] DOB: 1940/06/29 Today's Date: 02/12/2023  History of Present Illness  Francisco Reid is a 83 y.o. male with a PMH significant for DM, chronic bradycardia, HLD, HTN, melanoma, CAD s/p PCI, dysphagia.  At baseline, they live alone and are independent with ADLs.     They presented from home to the ED on 02/11/2023 with R arm weakness x1 days. He states that his last known normal was yesterday. He states that he woke up this morning with right arm pain. With further discussion, he describes the R arm as being unable to function but not painful. Has regained some movement of the arm but unable to lift it off the bed or make a fist. Has not had any injury, no prior similar episodes. Denies LE weakness, dysphagia or facial droop. He otherwise feels like himself.   Clinical Impression  Patient presents with RUE weakness and limited for use during functional mobility, demonstrates labored movement for sitting up at bedside, ataxic like gait when taking steps at bedside requiring Mod assist to avoid loss of balance, had to use RW for gait training demonstrating wide base of support and most difficulty making turns.  Patient tolerated sitting up in chair after therapy.  Patient will benefit from continued skilled physical therapy in hospital and recommended venue below to increase strength, balance, endurance for safe ADLs and gait.          If plan is discharge home, recommend the following: A lot of help with bathing/dressing/bathroom;A lot of help with walking and/or transfers;Help with stairs or ramp for entrance;Assistance with cooking/housework   Can travel by private vehicle        Equipment Recommendations None recommended by PT  Recommendations for Other Services       Functional Status Assessment Patient has had a recent decline in their functional status and demonstrates the ability to make significant  improvements in function in a reasonable and predictable amount of time.     Precautions / Restrictions Precautions Precautions: Fall Restrictions Weight Bearing Restrictions Per Provider Order: No      Mobility  Bed Mobility Overal bed mobility: Needs Assistance Bed Mobility: Supine to Sit     Supine to sit: Mod assist     General bed mobility comments: increased time, labored movement, limited use of RUE    Transfers Overall transfer level: Needs assistance Equipment used: Rolling walker (2 wheels), None Transfers: Sit to/from Stand, Bed to chair/wheelchair/BSC Sit to Stand: Mod assist   Step pivot transfers: Mod assist       General transfer comment: very unsteady on feet with ataxic like movement of BLE, safer using RW    Ambulation/Gait     Assistive device: Rolling walker (2 wheels) Gait Pattern/deviations: Decreased step length - left, Decreased stance time - right, Decreased stride length, Ataxic, Wide base of support Gait velocity: decreased     General Gait Details: unsteady labored ataxic like gait with wide base of support, unable to walk safely without AD due to fall risk, fair carryover for using RW  Stairs            Wheelchair Mobility     Tilt Bed    Modified Rankin (Stroke Patients Only)       Balance Overall balance assessment: Needs assistance Sitting-balance support: Feet supported, No upper extremity supported Sitting balance-Leahy Scale: Fair Sitting balance - Comments: fair/good seated at EOB   Standing balance support: During  functional activity, No upper extremity supported Standing balance-Leahy Scale: Poor Standing balance comment: fair/poor using RW                             Pertinent Vitals/Pain Pain Assessment Pain Assessment: No/denies pain    Home Living Family/patient expects to be discharged to:: Private residence Living Arrangements: Alone;Other (Comment) Available Help at Discharge:  Family;Personal care attendant Type of Home: House Home Access: Level entry       Home Layout: One level Home Equipment: Grab bars - tub/shower;Rolling Walker (2 wheels);Cane - single point      Prior Function Prior Level of Function : Independent/Modified Independent;Driving             Mobility Comments: Tourist information centre manager without AD ADLs Comments: Independent per pt.     Extremity/Trunk Assessment   Upper Extremity Assessment Upper Extremity Assessment: Defer to OT evaluation    Lower Extremity Assessment Lower Extremity Assessment: Generalized weakness    Cervical / Trunk Assessment Cervical / Trunk Assessment: Normal  Communication   Communication Communication: No apparent difficulties Cueing Techniques: Verbal cues;Tactile cues  Cognition Arousal: Alert Behavior During Therapy: WFL for tasks assessed/performed Overall Cognitive Status: Within Functional Limits for tasks assessed                                          General Comments      Exercises     Assessment/Plan    PT Assessment Patient needs continued PT services  PT Problem List Decreased strength;Decreased activity tolerance;Decreased balance;Decreased mobility       PT Treatment Interventions DME instruction;Gait training;Stair training;Functional mobility training;Therapeutic activities;Therapeutic exercise;Balance training;Patient/family education;Neuromuscular re-education    PT Goals (Current goals can be found in the Care Plan section)  Acute Rehab PT Goals Patient Stated Goal: return home after rehab PT Goal Formulation: With patient Time For Goal Achievement: 02/26/23 Potential to Achieve Goals: Good    Frequency Min 4X/week     Co-evaluation PT/OT/SLP Co-Evaluation/Treatment: Yes Reason for Co-Treatment: To address functional/ADL transfers PT goals addressed during session: Mobility/safety with mobility;Balance;Proper use of DME         AM-PAC  PT 6 Clicks Mobility  Outcome Measure Help needed turning from your back to your side while in a flat bed without using bedrails?: A Lot Help needed moving from lying on your back to sitting on the side of a flat bed without using bedrails?: A Lot Help needed moving to and from a bed to a chair (including a wheelchair)?: A Lot Help needed standing up from a chair using your arms (e.g., wheelchair or bedside chair)?: A Lot Help needed to walk in hospital room?: A Lot Help needed climbing 3-5 steps with a railing? : Total 6 Click Score: 11    End of Session Equipment Utilized During Treatment: Gait belt Activity Tolerance: Patient tolerated treatment well;Patient limited by fatigue Patient left: in chair;with call bell/phone within reach;with chair alarm set Nurse Communication: Mobility status PT Visit Diagnosis: Unsteadiness on feet (R26.81);Other abnormalities of gait and mobility (R26.89);Muscle weakness (generalized) (M62.81)    Time: 0900-0930 PT Time Calculation (min) (ACUTE ONLY): 30 min   Charges:   PT Evaluation $PT Eval Moderate Complexity: 1 Mod PT Treatments $Therapeutic Activity: 23-37 mins PT General Charges $$ ACUTE PT VISIT: 1 Visit  2:38 PM, 02/12/23 Lynwood Music, MPT Physical Therapist with [facility] [phone] office [phone] mobile phone

## 2023-02-12 NOTE — Progress Notes (Signed)
  Echocardiogram 2D Echocardiogram has been performed.  Leda Roys RDCS 02/12/2023, 2:49 PM

## 2023-02-12 NOTE — Plan of Care (Signed)
  Problem: Acute Rehab PT Goals(only PT should resolve) Goal: Pt Will Go Supine/Side To Sit Outcome: Progressing Flowsheets (Taken 02/12/2023 1440) Pt will go Supine/Side to Sit:  with minimal assist  with moderate assist Goal: Patient Will Transfer Sit To/From Stand Outcome: Progressing Flowsheets (Taken 02/12/2023 1440) Patient will transfer sit to/from stand:  with minimal assist  with moderate assist Goal: Pt Will Transfer Bed To Chair/Chair To Bed Outcome: Progressing Flowsheets (Taken 02/12/2023 1440) Pt will Transfer Bed to Chair/Chair to Bed:  with min assist  with mod assist Goal: Pt Will Ambulate Outcome: Progressing Flowsheets (Taken 02/12/2023 1440) Pt will Ambulate:  50 feet  with minimal assist  with moderate assist  with rolling walker   2:40 PM, 02/12/23 Lynwood Music, MPT Physical Therapist with [facility] [phone] office [phone] mobile phone

## 2023-02-12 NOTE — Progress Notes (Signed)
 PROGRESS NOTE    Francisco Reid  FMW:[id number] DOB: 1940-03-10 DOA: 02/11/2023 PCP: Patient, No Pcp Per   Brief Narrative:    Francisco Reid is a 83 y.o. male with a PMH significant for DM, chronic bradycardia, HLD, HTN, melanoma, CAD s/p PCI, dysphagia. At baseline, they live alone and are independent with ADLs.   They presented from home to the ED on 02/11/2023 with R arm weakness x1 days.  Patient was admitted for evaluation of acute ischemic CVA.  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 Denies chest pain, SOB. ECG sinus rhythm, non-acute.  - asa, plavix , statin as above   HTN- poorly controlled. No home medications listed.  - titrate on home regimen after permissive HTN for 48 hours   H/o DM. Diet controlled.  - hgb A1c 6.2%    DVT prophylaxis:SCDs Code Status: Full Family Communication:None at bedside  Disposition Plan:  Status is: Observation The patient will require care spanning > 2 midnights and should be moved to inpatient because: Need for placement and further evaluation.   Consultants:  Neurology  Procedures:  None  Antimicrobials:  None   Subjective: Patient seen and evaluated today with no new acute complaints or concerns. No acute concerns or events noted overnight.  He states his arm is feeling stronger.  Objective: Vitals:   02/12/23 0212 02/12/23 0401 02/12/23 0538 02/12/23 0857  BP: (!) 150/66 (!) 146/65 (!) 187/76 (!) 141/62  Pulse: 60  60 71 60  Resp: 17 17 16    Temp: 98.3 F (36.8 C) 97.7 F (36.5 C) 98.3 F (36.8 C) 98.4 F (36.9 C)  TempSrc: Oral Axillary Axillary Oral  SpO2: 100% 98% 98% 98%  Weight:      Height:        Intake/Output Summary (Last 24 hours) at 02/12/2023 1217 Last data filed at 02/12/2023 0834 Gross per 24 hour  Intake 243 ml  Output 300 ml  Net -57 ml   Filed Weights   02/11/23 1317  Weight: 95.3 kg    Examination:  General exam: Appears calm and comfortable  Respiratory system: Clear to auscultation. Respiratory effort normal. Cardiovascular system: S1 & S2 heard, RRR.  Gastrointestinal system: Abdomen is soft Central nervous system: Alert and awake Extremities: No edema Skin: No significant lesions noted Psychiatry: Flat affect.    Data Reviewed: I have personally reviewed following labs and imaging studies  CBC: Recent Labs  Lab 02/11/23 1340 02/11/23 1347  WBC 8.0  --   NEUTROABS 5.7  --   HGB 16.4 16.3  HCT 48.2 48.0  MCV 89.1  --   PLT 129*  --    Basic Metabolic Panel: Recent Labs  Lab 02/11/23 1340 02/11/23 1347  NA 136 141  K 3.8 3.9  CL 103 102  CO2 26  --   GLUCOSE 108* 105*  BUN 14 14  CREATININE 1.02 1.10  CALCIUM  8.9  --    GFR: Estimated Creatinine Clearance: 62 mL/min (by C-G formula based on SCr of 1.1 mg/dL).  Liver Function Tests: Recent Labs  Lab 02/11/23 1340  AST 21  ALT 17  ALKPHOS 104  BILITOT 0.8  PROT 6.8  ALBUMIN 3.9   No results for input(s): LIPASE, AMYLASE in the last 168 hours. No results for input(s): AMMONIA in the last 168 hours. Coagulation Profile: Recent Labs  Lab 02/11/23 1340  INR 1.1   Cardiac Enzymes: No results for input(s): CKTOTAL, CKMB, CKMBINDEX, TROPONINI in the last 168 hours. BNP (last 3 results) No results for input(s): PROBNP in the last 8760 hours. HbA1C: Recent Labs    02/11/23 1340  HGBA1C 6.2*   CBG: Recent Labs  Lab 02/11/23 1324  GLUCAP 124*   Lipid  Profile: Recent Labs    02/12/23 0427  CHOL 197  HDL 42  LDLCALC 142*  TRIG 64  CHOLHDL 4.7   Thyroid  Function Tests: Recent Labs    02/12/23 0427  TSH 0.821   Anemia Panel: No results for input(s): VITAMINB12, FOLATE, FERRITIN, TIBC, IRON, RETICCTPCT in the last 72 hours. Sepsis Labs: No results for input(s): PROCALCITON, LATICACIDVEN in the last 168 hours.  No results found for this or any previous visit (from the past 240 hours).       Radiology Studies: MR BRAIN WO CONTRAST Result Date: 02/11/2023 CLINICAL DATA:  Stroke suspected EXAM: MRI HEAD WITHOUT CONTRAST TECHNIQUE: Multiplanar, multiecho pulse sequences of the brain and surrounding structures were obtained without intravenous contrast. COMPARISON:  02/11/2023 CT head and CTA head and neck, no prior MRI available FINDINGS: Brain: Restricted diffusion with ADC correlates in the left frontal and parietal cortex (series 5, images 17, 27-33), right posterior basal ganglia/corona radiata (series 5, images 23-24), left posterior caudate (series 5, image 20), left occipital lobe (series 5, images 17-20), compatible with acute to early subacute infarcts. No acute hemorrhage, mass, mass effect, or midline shift. No hydrocephalus or extra-axial collection. Pituitary and craniocervical junction within normal limits. Hemosiderin deposition is associated with some of the suspected acute infarcts (series 13, image 50 and 54), which may indicate petechial hemorrhage. 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 Advanced cerebral volume loss for age, most prominent in left greater than right frontal parietal lobes. Confluent T2 hyperintense signal in the periventricular white matter, likely the sequela of severe chronic small vessel ischemic disease. Vascular: Loss of the left vertebral artery flow void (series 10, image  1), which correlates with the occlusion seen on the same-day CTA. Otherwise normal arterial flow voids. Skull and upper cervical spine: Normal marrow signal. Sinuses/Orbits: Clear paranasal sinuses. No acute finding in the orbits. Right lens replacement. Other: The mastoid air cells are well aerated. IMPRESSION: 1. Acute to early subacute infarcts in the left frontal and parietal cortex, right posterior basal ganglia/corona radiata, left posterior caudate, and left occipital lobe. Hemosiderin deposition is associated with some of these infarcts, which may indicate petechial hemorrhage. 2. Loss of the left vertebral artery flow void, which correlates with the occlusion seen on the same-day CTA These results were called by telephone at the time of interpretation on 02/11/2023 at 6:19 pm to provider STACK, who verbally acknowledged these results. Electronically Signed   By: Donald Campion M.D.   On: 02/11/2023 18:20   CT ANGIO HEAD NECK W WO CM W PERF (CODE STROKE) Result Date: 02/11/2023 CLINICAL DATA:  Neuro deficit, acute, stroke suspected RUE flaccid paralysis EXAM: CT ANGIOGRAPHY HEAD AND NECK CT PERFUSION BRAIN TECHNIQUE: Multidetector  CT imaging of the head and neck was performed using the standard protocol during bolus administration of intravenous contrast. Multiplanar CT image reconstructions and MIPs were obtained to evaluate the vascular anatomy. Carotid stenosis measurements (when applicable) are obtained utilizing NASCET criteria, using the distal internal carotid diameter as the denominator. Multiphase CT imaging of the brain was performed following IV bolus contrast injection. Subsequent parametric perfusion maps were calculated using RAPID software. RADIATION DOSE REDUCTION: This exam was performed according to the departmental dose-optimization program which includes automated exposure control, adjustment of the mA and/or kV according to patient size and/or use of iterative reconstruction technique.  CONTRAST:  OMNIPAQUE  IOHEXOL  350 MG/ML SOLN COMPARISON:  None Available. FINDINGS: CTA NECK FINDINGS Aortic arch: Aortic atherosclerosis. Great vessel origins are patent without significant stenosis. Right carotid system: Atherosclerosis at the carotid bifurcation and involving the proximal ICA with proximally 50% stenosis of the proximal ICA. Left carotid system: Atherosclerosis at the carotid bifurcation and involving the proximal ICA without greater than 50% stenosis. Vertebral arteries: Right dominant. No evidence of dissection, stenosis (50% or greater) or occlusion in the neck. Skeleton: Other neck: Upper chest: Review of the MIP images confirms the above findings CTA HEAD FINDINGS Anterior circulation: Bilateral intracranial ICAs are patent with moderate left and mild right cavernous ICA narrowing. Bilateral MCAs and ACAs are patent without proximal hemodynamically significant stenosis. 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 Right intradural vertebral artery is patent with mild stenosis at its dural margin. Basilar artery and bilateral posterior cerebral arteries are patent without proximal hemodynamically significant stenosis. Venous sinuses: As permitted by contrast timing, patent. Review of the MIP images confirms the above findings CT Brain Perfusion Findings: ASPECTS: 9 CBF (<30%) Volume: 0mL Perfusion (Tmax>6.0s) volume: 0mL Mismatch Volume: 0mL Infarction Location:None identified. IMPRESSION: CTA: 1. 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 2. 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 3. Approximately 50% stenosis of the proximal right ICA. 4.  Aortic Atherosclerosis (ICD10-I70.0). CT perfusion: No evidence of core infarct or penumbra. Preliminary results were communicated on 02/11/2023 at 2:26 pm to provider Dr. Matthews via secure text paging. Electronically Signed   By: Gilmore GORMAN Molt M.D.   On:  02/11/2023 14:27   CT HEAD CODE STROKE WO CONTRAST Result Date: 02/11/2023 CLINICAL DATA:  Code stroke. Provided history: Neuro deficit, acute, stroke suspected. EXAM: CT HEAD WITHOUT CONTRAST TECHNIQUE: Contiguous axial images were obtained from the base of the skull through the vertex without intravenous contrast. RADIATION DOSE REDUCTION: This exam was performed according to the departmental dose-optimization program which includes automated exposure control, adjustment of the mA and/or kV according to patient size and/or use of iterative reconstruction technique. COMPARISON:  None. FINDINGS: Brain: Generalized parenchymal atrophy. Commensurate prominence of the ventricles and sulci Age-indeterminate lacunar infarcts within the left centrum semiovale, left caudate nucleus and bilateral corona radiata. Moderate patchy and ill-defined hypoattenuation within the cerebral white matter, nonspecific but compatible with chronic small vessel ischemic disease. There is no acute intracranial hemorrhage. No demarcated cortical infarct. No extra-axial fluid collection. No evidence of an intracranial mass. No midline shift. Vascular: No hyperdense vessel.  Atherosclerotic calcifications. Skull: No calvarial fracture or aggressive osseous lesion. Sinuses/Orbits: No mass or acute finding within the imaged orbits. No significant paranasal sinus disease. ASPECTS [facility] Stroke Program Early CT Score) - Ganglionic level infarction (caudate, lentiform nuclei, internal capsule, insula, M1-M3 cortex): 6 - Supraganglionic infarction (M4-M6 cortex): 3 Total score (0-10 with 10  being normal): 9 These results were called by telephone at the time of interpretation on 02/11/2023 at 1:52 pm to provider BERNARDINO FIREMAN , who verbally acknowledged these results. IMPRESSION: 1. Age-indeterminate lacunar infarcts within the left centrum semiovale, left caudate nucleus and bilateral corona radiata. ASPECTS is 9. 2. Background parenchymal atrophy  and chronic small vessel ischemic disease. Electronically Signed   By: Rockey Childs D.O.   On: 02/11/2023 13:54        Scheduled Meds:  aspirin  EC  81 mg Oral Daily   atorvastatin   40 mg Oral Daily   clopidogrel   75 mg Oral Daily   sodium chloride  flush  3-10 mL Intravenous Q12H     LOS: 0 days    Time spent: 55 minutes    Francisco Faux JONETTA Fairly, DO [facility]  If 7PM-7AM, please contact night-coverage [email or web address] 02/12/2023, 12:17 PM

## 2023-02-12 NOTE — Plan of Care (Signed)

## 2023-02-12 NOTE — Plan of Care (Signed)
  Problem: Ischemic Stroke/TIA Tissue Perfusion: Goal: Complications of ischemic stroke/TIA will be minimized Outcome: Progressing   Problem: Coping: Goal: Will identify appropriate support needs Outcome: Progressing   Problem: Health Behavior/Discharge Planning: Goal: Ability to manage health-related needs will improve Outcome: Progressing Goal: Goals will be collaboratively established with patient/family Outcome: Progressing

## 2023-02-12 NOTE — TOC CM/SW Note (Signed)
 Transition of Care [facility]) - Inpatient Brief Assessment   Patient Details  Name: Francisco Reid MRN: [id number] Date of Birth: 1940/05/09  [facility] CM/SW Contact:    Lucie Lunger, LCSWA Phone Number: 02/12/2023, 1:59 PM   Clinical Narrative: CSW notes per chart review that PT/OT are recommending CIR for pt at D/C.   Transition of Care Department [facility]) has reviewed patient and no TOC needs have been identified at this time. We will continue to monitor patient advancement through interdisciplinary progression rounds. If new patient transition needs arise, please place a TOC consult.  Transition of Care Asessment: Insurance and Status: Insurance coverage has been reviewed Patient has primary care physician: Yes Home environment has been reviewed: From home Prior level of function:: Independent Prior/Current Home Services: No current home services Social Drivers of Health Review: SDOH reviewed no interventions necessary Readmission risk has been reviewed: Yes Transition of care needs: no transition of care needs at this time

## 2023-02-12 NOTE — Progress Notes (Signed)
 Patient alert with confusion, NIH completed every 2 hours as ordered. Patient refused to do allow this writer to assess patients NIH at times, patient stated I just want to go to sleep. MD Alefesco made aware. Patient attempted to get out of bed during shift, patient reoriented and assisted back to bed. Repositioned every 2 hours as ordered. Reported no complaints of pain during shift.

## 2023-02-12 NOTE — Plan of Care (Signed)
  Problem: Education: Goal: Knowledge of disease or condition will improve Outcome: Progressing Goal: Knowledge of secondary prevention will improve (MUST DOCUMENT ALL) Outcome: Progressing   Problem: Education: Goal: Knowledge of secondary prevention will improve (MUST DOCUMENT ALL) Outcome: Progressing

## 2023-02-12 NOTE — Progress Notes (Signed)
 Inpatient Rehab Admissions Coordinator Note:   Per therapy recommendations patient was screened for CIR candidacy by Reche FORBES Lowers, PT. At this time, pt appears to be a potential candidate for CIR. I will place an order for rehab consult for full assessment, per our protocol and an [facility] will f/u.   Reche Lowers, PT, DPT [phone] 02/12/23 4:11 PM

## 2023-02-13 DIAGNOSIS — I639 Cerebral infarction, unspecified: Secondary | ICD-10-CM | POA: Diagnosis not present

## 2023-02-13 MED ORDER — ALPRAZOLAM 0.25 MG PO TABS
0.2500 mg | ORAL_TABLET | Freq: Two times a day (BID) | ORAL | Status: DC | PRN
  Administered 2023-02-13: 0.25 mg via ORAL
  Filled 2023-02-13 (×2): qty 1

## 2023-02-13 MED ORDER — AMLODIPINE BESYLATE 5 MG PO TABS
5.0000 mg | ORAL_TABLET | Freq: Every day | ORAL | Status: DC
Start: 2023-02-13 — End: 2023-02-14
  Administered 2023-02-13 – 2023-02-14 (×2): 5 mg via ORAL
  Filled 2023-02-13: qty 1

## 2023-02-13 MED ORDER — LOSARTAN POTASSIUM 25 MG PO TABS: 25.0000 mg | ORAL_TABLET | Freq: Every day | ORAL | Status: DC

## 2023-02-13 NOTE — Progress Notes (Signed)
 Patient will not stay in bed or chair , incontinence care provided . Patient confused and noncompliant with safety measures

## 2023-02-13 NOTE — Progress Notes (Signed)
 Mobility Specialist Progress Note:    02/13/23 1200  Mobility  Activity Transferred to/from BSC;Stood at bedside  Level of Assistance Minimal assist, patient does 75% or more  Assistive Device Front wheel walker  Distance Ambulated (ft) 2 ft  Range of Motion/Exercises Active;Left arm;Right leg;Left leg;Active Assistive;Right arm  Activity Response Tolerated fair  Mobility visit 1 Mobility  Mobility Specialist Start Time (ACUTE ONLY) 1000  Mobility Specialist Stop Time (ACUTE ONLY) 1015  Mobility Specialist Time Calculation (min) (ACUTE ONLY) 15 min   Pt received in chair, NT requesting assistance to [facility]. Required MinA to stand and transfer with RW. Tolerated fair, pt confused and not following verbal cues. Left pt in chair, alarm on, NT in room. All needs met.   Glinda Lapping Mobility Specialist Please contact via Special educational needs teacher or  Rehab office at [phone]

## 2023-02-13 NOTE — Progress Notes (Signed)
 Mobility Specialist Progress Note:    02/13/23 1530  Mobility  Activity Ambulated with assistance in room;Ambulated with assistance to bathroom  Level of Assistance Minimal assist, patient does 75% or more  Assistive Device Front wheel walker;None  Distance Ambulated (ft) 15 ft  Range of Motion/Exercises Active;Left arm;Right leg;Left leg;Active Assistive;Right arm  Activity Response Tolerated well  Mobility visit 1 Mobility  Mobility Specialist Start Time (ACUTE ONLY) 1500  Mobility Specialist Stop Time (ACUTE ONLY) 1520  Mobility Specialist Time Calculation (min) (ACUTE ONLY) 20 min   Pt received attempting to get OOB with no assistance. Required MinA to stand and ambulate with RW. Tolerated well, PT came in to assist as well. Left pt supine, alarm on. PT educated pt on importance of safety and assistance when getting OOB. All needs met.   Glinda Lapping Mobility Specialist Please contact via Special educational needs teacher or  Rehab office at [phone]

## 2023-02-13 NOTE — Progress Notes (Signed)
  Inpatient Rehabilitation Admissions Coordinator   I spoke with pt's nephew by phone. We discussed goals and expectations of a possible CIR admit.  Patient has a caregiver, Almira Armour, who comes in for 3 to 4 hrs per day to assist in the am  with meal prep. He does not have 24/7 caregiver supports at home which he will need. Nephew would like to pursue SNF at this time. We will not pursue Cir at this time. Acute team and TOC made aware. We will sign off. Please call me with any questions.   Jeannetta Millman, RN, MSN Rehab Admissions Coordinator [phone]

## 2023-02-13 NOTE — TOC Initial Note (Signed)
 Transition of Care [facility]) - Initial/Assessment Note    Patient Details  Name: Francisco Reid MRN: [id number] Date of Birth: [birth year]  [facility]) CM/SW Contact:    Grandville Lax, LCSWA Phone Number: 02/13/2023, 11:51 AM  Clinical Narrative:                 CSW udpated that CIR states they are unable to accept pt. CSW spoke to pts nephew who states preference would be SNF in [place] if possible. CSW met with pt at bedside to review SNF placement, he is agreeable and confirms wish to go to a SNF in [place]. CSW to complete SNF referral and get auth started. TOC to follow.   Expected Discharge Plan: Skilled Nursing Facility Barriers to Discharge: Continued Medical Work up   Patient Goals and CMS Choice Patient states their goals for this hospitalization and ongoing recovery are:: Go to SNF CMS Medicare.gov Compare Post Acute Care list provided to:: Patient Choice offered to / list presented to : Patient [place] ownership interest in [facility].provided to:: Patient    Expected Discharge Plan and Services In-house Referral: Clinical Social Work Discharge Planning Services: CM Consult Post Acute Care Choice: Skilled Nursing Facility Living arrangements for the past 2 months: Single Family Home                                      Prior Living Arrangements/Services Living arrangements for the past 2 months: Single Family Home Lives with:: Self Patient language and need for interpreter reviewed:: Yes Do you feel safe going back to the place where you live?: Yes      Need for Family Participation in Patient Care: Yes (Comment) Care giver support system in place?: Yes (comment)   Criminal Activity/Legal Involvement Pertinent to Current Situation/Hospitalization: No - Comment as needed  Activities of Daily Living   ADL Screening (condition at time of admission) Independently performs ADLs?: Yes (appropriate for developmental  age) Is the patient deaf or have difficulty hearing?: No Does the patient have difficulty seeing, even when wearing glasses/contacts?: No Does the patient have difficulty concentrating, remembering, or making decisions?: No  Permission Sought/Granted                  Emotional Assessment Appearance:: Appears stated age Attitude/Demeanor/Rapport: Engaged Affect (typically observed): Accepting Orientation: : Oriented to Self, Oriented to Place Alcohol / Substance Use: Not Applicable Psych Involvement: No (comment)  Admission diagnosis:  CVA (cerebral vascular accident) (HCC) [I63.9] Stroke-like symptoms [R29.90] Patient Active Problem List   Diagnosis Date Noted   CVA (cerebral vascular accident) (HCC) 02/11/2023   Acute CVA (cerebrovascular accident) (HCC) 02/11/2023   Angioedema 06/25/2015   PCP:  Patient, No Pcp Per Pharmacy:   [facility] - [place], [place] - [address] [place] [address] Phone: [phone] Fax: [phone]     Social Drivers of Health (SDOH) Social History: SDOH Screenings   Food Insecurity: No Food Insecurity (02/11/2023)  Housing: Low Risk  (02/11/2023)  Transportation Needs: No Transportation Needs (02/11/2023)  Utilities: Not At Risk (02/11/2023)  Social Connections: Patient Declined (02/11/2023)  Tobacco Use: Medium Risk (02/11/2023)   SDOH Interventions:     Readmission Risk Interventions     No data to display

## 2023-02-13 NOTE — TOC Progression Note (Signed)
 Transition of Care [facility]) - Progression Note    Patient Details  Name: Francisco Reid MRN: [id number] Date of Birth: [birth year]  Transition of Care [facility]) CM/SW Contact  Grandville Lax, [place] Phone Number: 02/13/2023, 3:16 PM  Clinical Narrative:    CSW spoke to pts nephew who states they accept bed at [facility] as it is in closest proximity to family. CSW updated [facility] with [facility] who states they will hold bed for pt. CSW updated insurance auth of pts facility choice. TOC to follow.   Expected Discharge Plan: Skilled Nursing Facility Barriers to Discharge: Continued Medical Work up  Expected Discharge Plan and Services In-house Referral: Clinical Social Work Discharge Planning Services: CM Consult Post Acute Care Choice: Skilled Nursing Facility Living arrangements for the past 2 months: Single Family Home                                       Social Determinants of Health (SDOH) Interventions SDOH Screenings   Food Insecurity: No Food Insecurity (02/11/2023)  Housing: Low Risk  (02/11/2023)  Transportation Needs: No Transportation Needs (02/11/2023)  Utilities: Not At Risk (02/11/2023)  Social Connections: Patient Declined (02/11/2023)  Tobacco Use: Medium Risk (02/11/2023)    Readmission Risk Interventions     No data to display

## 2023-02-13 NOTE — Progress Notes (Signed)
 Mobility Specialist Progress Note:    02/13/23 1140  Mobility  Activity Transferred from chair to bed  Level of Assistance Minimal assist, patient does 75% or more  Assistive Device None  Distance Ambulated (ft) 2 ft  Range of Motion/Exercises Active;Left arm;Right leg;Left leg;Active Assistive;Right arm  Activity Response Tolerated fair  Mobility visit 1 Mobility  Mobility Specialist Start Time (ACUTE ONLY) 1130  Mobility Specialist Stop Time (ACUTE ONLY) 1140  Mobility Specialist Time Calculation (min) (ACUTE ONLY) 10 min   Pt received attempting to transfer to bed with no assistance. Required MinA to stand and transfer with no AD. Tolerated fair, pt is confused and does not follow verbal cues. Educated pt to use call bell. Left supine, alarm on and call bell in hand. All needs met.    Glinda Lapping Mobility Specialist Please contact via Special educational needs teacher or  Rehab office at [phone]

## 2023-02-13 NOTE — Evaluation (Signed)
 Speech Language Pathology Evaluation Patient Details Name: Francisco Reid MRN: [id number] DOB: 12/28/1940 Today's Date: 02/13/2023 Time: 2956-2130 SLP Time Calculation (min) (ACUTE ONLY): 21 min  Problem List:  Patient Active Problem List   Diagnosis Date Noted   CVA (cerebral vascular accident) (HCC) 02/11/2023   Acute CVA (cerebrovascular accident) (HCC) 02/11/2023   Angioedema 06/25/2015   Past Medical History:  Past Medical History:  Diagnosis Date   Cancer (HCC)    melanoma   Chronic sinus bradycardia    Coronary artery disease    Diabetes mellitus without complication (HCC)    History of dysphagia    Hypercholesterolemia    Hypertension    Shortness of breath dyspnea    Thyroid  nodule    Past Surgical History:  Past Surgical History:  Procedure Laterality Date   c-5 neck surgery     COLON SURGERY     COLONOSCOPY WITH ESOPHAGOGASTRODUODENOSCOPY (EGD)     COLONOSCOPY WITH PROPOFOL  N/A 07/12/2015   Procedure: COLONOSCOPY WITH PROPOFOL ;  Surgeon: Deveron Fly, MD;  Location: [facility] ENDOSCOPY;  Service: Endoscopy;  Laterality: N/A;   CORONARY ANGIOPLASTY WITH STENT PLACEMENT     ESOPHAGOGASTRODUODENOSCOPY (EGD) WITH PROPOFOL  N/A 07/12/2015   Procedure: ESOPHAGOGASTRODUODENOSCOPY (EGD) WITH PROPOFOL ;  Surgeon: Deveron Fly, MD;  Location: [facility] ENDOSCOPY;  Service: Endoscopy;  Laterality: N/A;   HERNIA REPAIR     HPI:  Francisco Reid is a 83 y.o. male with a PMH significant for DM, chronic bradycardia, HLD, HTN, melanoma, CAD s/p PCI, dysphagia.  At baseline, they live alone and are independent with ADLs.     They presented from home to the ED on 02/11/2023 with R arm weakness x1 days. He states that his last known normal was yesterday. He states that he woke up this morning with right arm "pain". With further discussion, he describes the R arm as being unable to function but not painful. Has regained some movement of the arm but unable to lift it off the bed or make a  fist. Has not had any injury, no prior similar episodes. Denies LE weakness, dysphagia or facial droop. He otherwise feels like himself. SLE ordered   Assessment / Plan / Recommendation Clinical Impression  Speech language evaluation completed while Pt was sitting up in the chair. Pt presents with severe cognitive impariment; no aphasia or dysarthria noted. Pt was initially very pleasant but very quickly became agitated. Pt presents with a poor frustration tolerance, impulsivity and restless behaviors. Pt was unsettled in the chair, requesting to go to the bathroom and requiring reminders every couple of minutes that he had a catheter on and did not need to go to the bathroom to urinate. Note very poor awareness of his physical changes/limitations; pt attempting to stand and requiring max verbal cues and reminders that he needed assistance and was not safe to move to the bed or walk which often further frustrated the patient. Pt was oriented to city and place and did report that he had a stroke. Only portions of the [facility] SLUMS were administered as Pt became increasingly frustrated and agitated with questions. Note poor recall and impaired thought organization and problem solving. Pt is very unsafe and will require 24 hour support/supervision secondary to physical limitations/high fall risk and cognitive impairment. ST will continue to follow acutely and will benefit from further cognitive work up and treatment as indicated at recommended venue below. Thank you for this referral    SLP Assessment  SLP Recommendation/Assessment: Patient needs  continued Speech Lanaguage Pathology Services SLP Visit Diagnosis: Cognitive communication deficit [phone])    Recommendations for follow up therapy are one component of a multi-disciplinary discharge planning process, led by the attending physician.  Recommendations may be updated based on patient status, additional functional criteria and insurance authorization.     Follow Up Recommendations  Skilled nursing-short term rehab (<3 hours/day)    Assistance Recommended at Discharge  Frequent or constant Supervision/Assistance  Functional Status Assessment Patient has had a recent decline in their functional status and demonstrates the ability to make significant improvements in function in a reasonable and predictable amount of time.  Frequency and Duration min 1 x/week  1 week      SLP Evaluation Cognition  Overall Cognitive Status: Impaired/Different from baseline Orientation Level: Oriented to place;Oriented to situation;Oriented to person Year: 2024 Day of Week: Incorrect Attention: Focused Focused Attention: Impaired Focused Attention Impairment: Verbal basic Memory: Impaired Memory Impairment: Decreased short term memory;Decreased recall of new information Decreased Short Term Memory: Verbal basic Awareness: Impaired Awareness Impairment: Intellectual impairment Problem Solving: Impaired Problem Solving Impairment: Verbal basic Executive Function: Reasoning Reasoning: Impaired Reasoning Impairment: Verbal basic Behaviors: Impulsive;Physical agitation;Verbal agitation;Poor frustration tolerance;Restless Safety/Judgment: Impaired       Comprehension  Auditory Comprehension Overall Auditory Comprehension: Appears within functional limits for tasks assessed Yes/No Questions: Within Functional Limits Commands: Impaired One Step Basic Commands: 75-100% accurate Two Step Basic Commands: 50-74% accurate Conversation: Simple Visual Recognition/Discrimination Discrimination: Not tested Reading Comprehension Reading Status: Not tested    Expression Expression Primary Mode of Expression: Verbal Verbal Expression Overall Verbal Expression: Appears within functional limits for tasks assessed   Oral / Motor  Motor Speech Overall Motor Speech: Appears within functional limits for tasks assessed           Francisco Reid,  CCC-SLP Speech Language Pathologist  Florina Husbands 02/13/2023, 2:52 PM

## 2023-02-13 NOTE — Progress Notes (Addendum)
 [facility] PROGRESS NOTE  Francisco Reid (DOB: Apr 02, 1940) GNF:[id number] PCP: Patient, No Pcp Per  Brief Narrative: Francisco Reid is an 83 y.o. male with a history of T2DM, HTN, HLD, bradycardia, melanoma, and CAD s/p PCI who presented to the ED on 02/11/2023 with right arm weakness found to have acute CVAs.  Subjective: Stable weakness in the right (dominant) hand and wrist, no numbness or tingling. He's intermittently confused and not redirectable.   Objective: BP (!) 158/74 (BP Location: Left Arm)   Pulse 65   Temp 97.7 F (36.5 C) (Oral)   Resp 16   Ht 6' (1.829 m)   Wt 95.3 kg   SpO2 97%   BMI 28.49 kg/m   Gen: Elderly male in no acute distress Pulm: Clear, nonlabored  CV: RRR, no MRG or edema. NSR/SB only on monitor review today. GI: Soft, NT, ND, +BS  Neuro: Alert and interactive, R hand grip 0/5, wrist flexion 1/5, elbow and shoulder 4/5. No new focal deficits. Ext: Warm, no deformities Skin: No new rashes, lesions or ulcers on visualized skin   Assessment & Plan: Acute CVA: Multiple lacunar infarcts. 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  - Multiple contemporaneous strokes, neurology suspects embolic source.  - Continue DAPT x3 months, then plavix  monotherapy (had been on this after coronary stenting) - Continue atorvastatin  40mg  (LDL 142). - Echo shows no cardioembolic source. Neurology recommends 30-day cardiac monitoring after discharge if no atrial fibrillation is detected on cardiac monitoring here (none thus far). Would likely be arranged through [facility] with his cardiologist (Dr. Beau Bound).  - Follow up with neurology in 3 months.  - Will pursue rehabilitation at SNF level of care.   CAD s/p remote PCI, HTN, HLD:  - Antiplatelets as above, statin as above.  - Start norvasc  now that we're outside window of permissive HTN and continue management based on trends.  - Avoid beta  blocker with bradycardia.   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 No regional wall motion abnormalities to suggest ACS. Euvolemic currently. GDMT limited by bradycardia and noted history of angioedema in the setting of lisinopril use.   Diet-controlled T2DM: HbA1c 6.2%. At goal in 83yo.   Acute delirium: Due to stroke on suspected chronic cognitive impairment. Notes from 2023 mentioned him having wandering episodes at that time.  - Consider formal neuropsychiatric battery as outpatient - Avoid aricept with chronic bradycardia  Wynetta Heckle, MD [facility] [email or web address] 02/13/2023, 2:07 PM

## 2023-02-13 NOTE — Progress Notes (Signed)
 Physical Therapy Note  Patient Details  Name: Francisco Reid MRN: [id number] Date of Birth: Oct 14, 1940 Today's Date: 02/13/2023    Pt with increased confusion, improperly following commands.  Not appropriate for therapy today.   [name], [name] B 02/13/2023, 10:43 AM

## 2023-02-13 NOTE — Progress Notes (Addendum)
 Given PRN xanax  PO,attempting to get OOB without assistance.

## 2023-02-13 NOTE — Progress Notes (Signed)
 Pt restless in bed c/o pain at condom catheter. This RN removed condom cath and assisted him to the bedside to stand and use urinal ( walker was used) . He was able to make a few dribbles. He is  weak on his right side. He is alert and oriented x 1. Disoriented to age, location but knows he's in the hospital,and disoriented to time ( he's states its November). [place] RN

## 2023-02-13 NOTE — Plan of Care (Signed)
   Problem: Health Behavior/Discharge Planning: Goal: Ability to manage health-related needs will improve Outcome: Progressing

## 2023-02-13 NOTE — Progress Notes (Signed)
 Mobility Specialist Progress Note:    02/13/23 0935  Mobility  Activity Transferred from bed to chair  Level of Assistance Moderate assist, patient does 50-74%  Assistive Device Front wheel walker  Distance Ambulated (ft) 4 ft  Range of Motion/Exercises Active;Left arm;Right leg;Left leg;Active Assistive;Right arm  Activity Response Tolerated well  Mobility Referral Yes  Mobility visit 1 Mobility  Mobility Specialist Start Time (ACUTE ONLY) [id number]  Mobility Specialist Stop Time (ACUTE ONLY) 0935  Mobility Specialist Time Calculation (min) (ACUTE ONLY) 10 min   Pt received requesting assistance to chair. Required ModA to stand and transfer with RW. Tolerated well, asx throughout. Left pt in chair, alarm on and call bell in hand. All needs met.   Glinda Lapping Mobility Specialist Please contact via Special educational needs teacher or  Rehab office at [phone]

## 2023-02-13 NOTE — Progress Notes (Signed)
 Mobility Specialist Progress Note:    02/13/23 1330  Mobility  Activity Ambulated with assistance in hallway;Transferred from bed to chair  Level of Assistance Minimal assist, patient does 75% or more  Assistive Device Front wheel walker  Distance Ambulated (ft) 20 ft  Range of Motion/Exercises Active;Left arm;Right leg;Left leg;Active Assistive;Right arm  Activity Response Tolerated well  Mobility Referral Yes  Mobility visit 1 Mobility  Mobility Specialist Start Time (ACUTE ONLY) 1305  Mobility Specialist Stop Time (ACUTE ONLY) 1325  Mobility Specialist Time Calculation (min) (ACUTE ONLY) 20 min   Pt received in bed, requesting assistance to ambulate. Required MinA to stand and ambulate with RW. Tolerated well, has LOB and RUE weakness. Left pt in chair, alarm on and call bell in reach. Speech therapist in room, all needs met.   Glinda Lapping Mobility Specialist Please contact via Special educational needs teacher or  Rehab office at [phone]

## 2023-02-13 NOTE — NC FL2 (Signed)
 [place]  MEDICAID FL2 LEVEL OF CARE FORM     IDENTIFICATION  Patient Name: Francisco Reid Birthdate: Feb 14, 1940 Sex: male Admission Date (Current Location): 02/11/2023  [facility] and [place] Number:  [employer] and Address:  [facility],  [address], [place] [address]      Provider Number: [id number]  Attending Physician Name and Address:  Wynetta Heckle, MD  Relative Name and Phone Number:  Jerme, Arrison [facility])  [phone]    Current Level of Care: Hospital Recommended Level of Care: Skilled Nursing Facility Prior Approval Number:    Date Approved/Denied:   PASRR Number: [id number]  Discharge Plan: SNF    Current Diagnoses: Patient Active Problem List   Diagnosis Date Noted   CVA (cerebral vascular accident) (HCC) 02/11/2023   Acute CVA (cerebrovascular accident) (HCC) 02/11/2023   Angioedema 06/25/2015    Orientation RESPIRATION BLADDER Height & Weight     Self, Place, Situation  Normal Continent Weight: 210 lb 1.6 oz (95.3 kg) Height:  6' (182.9 cm)  BEHAVIORAL SYMPTOMS/MOOD NEUROLOGICAL BOWEL NUTRITION STATUS      Continent Diet (Regular)  AMBULATORY STATUS COMMUNICATION OF NEEDS Skin   Extensive Assist Verbally Normal                       Personal Care Assistance Level of Assistance  Bathing, Feeding, Dressing Bathing Assistance: Limited assistance Feeding assistance: Independent Dressing Assistance: Limited assistance     Functional Limitations Info  Sight, Hearing, Speech Sight Info: Adequate Hearing Info: Adequate Speech Info: Adequate    SPECIAL CARE FACTORS FREQUENCY  PT (By licensed PT), OT (By licensed OT)     PT Frequency: 5 times weekly OT Frequency: 5 times weekly            Contractures Contractures Info: Not present    Additional Factors Info  Code Status, Allergies Code Status Info: FULL Allergies Info: Enalapril and Keflet (cephalexin)           Current Medications (02/13/2023):   This is the current hospital active medication list Current Facility-Administered Medications  Medication Dose Route Frequency Provider Last Rate Last Admin   acetaminophen  (TYLENOL ) tablet 650 mg  650 mg Oral Q4H PRN Ree Candy, MD   650 mg at 02/13/23 1055   Or   acetaminophen  (TYLENOL ) 160 MG/5ML solution 650 mg  650 mg Per Tube Q4H PRN Ree Candy, MD       Or   acetaminophen  (TYLENOL ) suppository 650 mg  650 mg Rectal Q4H PRN Ree Candy, MD       amLODipine  (NORVASC ) tablet 5 mg  5 mg Oral Daily Grunz, Ryan B, MD   5 mg at 02/13/23 0945   aspirin  EC tablet 81 mg  81 mg Oral Daily Shah, Pratik D, DO   81 mg at 02/13/23 0913   atorvastatin  (LIPITOR) tablet 40 mg  40 mg Oral Daily Ree Candy, MD   40 mg at 02/13/23 0913   clopidogrel  (PLAVIX ) tablet 75 mg  75 mg Oral Daily Shah, Pratik D, DO   75 mg at 02/13/23 0913   sodium chloride  flush (NS) 0.9 % injection 3-10 mL  3-10 mL Intravenous Q12H Evette Hoes L, MD   5 mL at 02/13/23 1110   sodium chloride  flush (NS) 0.9 % injection 3-10 mL  3-10 mL Intravenous PRN Ree Candy, MD         Discharge Medications: Please see discharge summary for  a list of discharge medications.  Relevant Imaging Results:  Relevant Lab Results:   Additional Information SSN: [id number] LCSWA

## 2023-02-14 DIAGNOSIS — I639 Cerebral infarction, unspecified: Secondary | ICD-10-CM | POA: Diagnosis not present

## 2023-02-14 MED ORDER — ASPIRIN 81 MG PO TBEC: 81.0000 mg | DELAYED_RELEASE_TABLET | Freq: Every day | ORAL | Status: AC

## 2023-02-14 MED ORDER — AMLODIPINE BESYLATE 10 MG PO TABS: 10.0000 mg | ORAL_TABLET | Freq: Every day | ORAL | Status: DC

## 2023-02-14 MED ORDER — ATORVASTATIN CALCIUM 40 MG PO TABS: 40.0000 mg | ORAL_TABLET | Freq: Every day | ORAL | Status: DC

## 2023-02-14 MED ORDER — CLOPIDOGREL BISULFATE 75 MG PO TABS: 75.0000 mg | ORAL_TABLET | Freq: Every day | ORAL | Status: DC

## 2023-02-14 NOTE — Plan of Care (Signed)
  Problem: Education: Goal: Knowledge of General Education information will improve Description: Including pain rating scale, medication(s)/side effects and non-pharmacologic comfort measures Outcome: Not Progressing   Problem: Health Behavior/Discharge Planning: Goal: Ability to manage health-related needs will improve Outcome: Not Progressing   

## 2023-02-14 NOTE — Progress Notes (Addendum)
Continues with right sided weakness of arm and leg.  Able to ambulate slowly with walker.  Denies dizziness and has some confusion but knows that he has had a stroke.   IV removed and assisted to get dressed needing a lot of help.  Report called to [facility] nurse , Marcelino Duster, and waiting on [facility] to transport

## 2023-02-14 NOTE — Discharge Summary (Signed)
Physician Discharge Summary   Patient: Francisco Reid MRN: [id number] DOB: Mar 10, 1940  Admit date:     02/11/2023  Discharge date: 02/14/23  Discharge Physician: Tyrone Nine   PCP: Patient, No Pcp Per   Recommendations at discharge:  Follow up with PCP and/or SNF provider after discharge for ongoing management of HTN, diet-controlled T2DM.  Follow up with neurology after discharge for post-stroke care.  Cardiology to arrange cardiac monitoring per neurology recommendations.   Discharge Diagnoses: Principal Problem:   CVA (cerebral vascular accident) [facility]) Active Problems:   Acute CVA (cerebrovascular accident) [facility])  Hospital Course: Francisco Reid is an 83 y.o. male with a history of T2DM, HTN, HLD, bradycardia, melanoma, and CAD s/p PCI who presented to the ED on 02/11/2023 with right arm weakness found to have acute CVAs. Work up has suggested embolic etiology without any source identified. Medications have been started and tolerated for secondary risk reduction and SNF disposition for rehabilitation is pursued.   Assessment and Plan: Acute CVA: Multiple lacunar infarcts. 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  - Multiple contemporaneous strokes, neurology suspects embolic source.  - Continue DAPT x3 months, then plavix monotherapy (had been on this after coronary stenting) - Continue atorvastatin 40mg  (LDL 142). - Echo shows no cardioembolic source, no atrial fibrillation detected on cardiac monitoring during admission. Neurology recommends 30-day cardiac monitoring after discharge. Will see if [facility] can arrange this after discharge as pt lives near here and wasn't seen by cardiology in the past 4 years.  - Follow up with neurology in 3 months. Referral placed.  - Will pursue rehabilitation at SNF level of care.    CAD s/p remote PCI, HTN, HLD:  - Antiplatelets as above, statin as above.  - Started  norvasc, continue at DC  - Avoiding beta blocker with bradycardia.    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 No regional wall motion abnormalities to suggest ACS. Euvolemic currently. GDMT limited by bradycardia and noted history of angioedema in the setting of lisinopril use.    Diet-controlled T2DM: HbA1c 6.2%. At goal in 82yo.    Acute delirium: Due to stroke on suspected chronic cognitive impairment. Notes from 2023 mentioned him having wandering episodes at that time.  - Consider formal neuropsychiatric battery as outpatient - Avoid aricept with chronic bradycardia  Consultants: Neurology Procedures performed: None  Disposition: Skilled nursing facility Diet recommendation:  Cardiac and Carb modified diet DISCHARGE MEDICATION: Allergies as of 02/14/2023       Reactions   Enalapril    Keflet [cephalexin] Rash        Medication List     TAKE these medications    amLODipine 10 MG tablet Commonly known as: NORVASC Take 1 tablet (10 mg total) by mouth daily. Start taking on: February 15, 2023   aspirin EC 81 MG tablet Take 1 tablet (81 mg total) by mouth daily. Swallow whole. Start taking on: February 15, 2023   atorvastatin 40 MG tablet Commonly known as: LIPITOR Take 1 tablet (40 mg total) by mouth daily. Start taking on: February 15, 2023   clopidogrel 75 MG tablet Commonly known as: PLAVIX Take 1 tablet (75 mg total) by mouth daily. Start taking on: February 15, 2023        Contact information for follow-up providers     [facility] Follow up.   Specialty: Neurology Contact  information: [address] [place] [address] [phone]             Contact information for after-discharge care     Destination     [facility] Preferred SNF .   Service: Skilled Nursing Contact information: [address] [place]  [place] [address] [phone]                    Discharge Exam: Francisco Reid Weights   02/11/23 1317  Weight: 95.3 kg  BP (!) 174/103   Pulse 67   Temp 98.2 F (36.8 C) (Oral)   Resp 16   Ht 6' (1.829 m)   Wt 95.3 kg   SpO2 95%   BMI 28.49 kg/m   No distress, alert, incompletely oriented but appropriately interactive this morning, fed himself all of breakfast.  Clear, nonlabored RRR, NSR on cardiac telemetry review at time of discharge. Soft, NT, ND Stable R sided weakness in distal > proximal RUE, somewhat in RLE, abnormal but intact sensation.  Condition at discharge: stable  The results of significant diagnostics from this hospitalization (including imaging, microbiology, ancillary and laboratory) are listed below for reference.   Imaging Studies: ECHOCARDIOGRAM COMPLETE Result Date: 02/12/2023    ECHOCARDIOGRAM REPORT   Patient Name:   Francisco Reid Date of Exam: 02/12/2023 Medical Rec #:  [id number]      Height:       72.0 in Accession #:    [id number]     Weight:       210.1 lb Date of Birth:  [birth year]      BSA:          2.176 m Patient Age:    82 years       BP:           141/62 mmHg Patient Gender: M              HR:           60 bpm. [facility]am [facility] Procedure: 2D Echo, Color Doppler and Cardiac Doppler Indications:   Stroke I63.9  History:       Patient has no prior history of Echocardiogram examinations.                Stroke.  Sonographer:   Harriette Bouillon RDCS Referring      [id number] Leeroy Bock Phys: IMPRESSIONS  1. Left ventricular ejection fraction, by estimation, is 45 to 50%. The left ventricle has mildly decreased function. The left ventricle demonstrates global hypokinesis. The left ventricular internal cavity size was mildly dilated. There is mild concentric left ventricular hypertrophy. Left ventricular diastolic parameters are consistent with Grade I diastolic dysfunction (impaired relaxation).  2. Right ventricular systolic function is low  normal. The right ventricular size is normal.  3. The mitral valve is grossly normal. Trivial mitral valve regurgitation.  4. The aortic valve was not well visualized. There is mild calcification of the aortic valve. Aortic valve regurgitation is not visualized.  5. Unable to estimate CVP. Comparison(s): Prior images unable to be directly viewed, comparison made by report only. LVEF reported to be 45% in 2017 suggesting relative stability with current study. FINDINGS  Left Ventricle: Left ventricular ejection fraction, by estimation, is 45 to 50%. The left ventricle has mildly decreased function. The left ventricle demonstrates global hypokinesis. The left ventricular internal cavity size was mildly dilated. There is  mild concentric left ventricular hypertrophy. Left ventricular  diastolic parameters are consistent with Grade I diastolic dysfunction (impaired relaxation). Right Ventricle: The right ventricular size is normal. No increase in right ventricular wall thickness. Right ventricular systolic function is low normal. Left Atrium: Left atrial size was normal in size. Right Atrium: Right atrial size was normal in size. Pericardium: There is no evidence of pericardial effusion. Presence of epicardial fat layer. Mitral Valve: The mitral valve is grossly normal. Trivial mitral valve regurgitation. Tricuspid Valve: The tricuspid valve is grossly normal. Tricuspid valve regurgitation is trivial. Aortic Valve: The aortic valve was not well visualized. There is mild calcification of the aortic valve. There is mild aortic valve annular calcification. Aortic valve regurgitation is not visualized. Pulmonic Valve: The pulmonic valve was not well visualized. Pulmonic valve regurgitation is not visualized. Aorta: The aortic root is normal in size and structure. Venous: Unable to estimate CVP. The inferior vena cava was not well visualized. IAS/Shunts: No atrial level shunt detected by color flow Doppler.  LEFT VENTRICLE  PLAX 2D LVIDd:         6.10 cm   Diastology LVIDs:         5.10 cm   LV e' medial:    2.72 cm/s LV PW:         1.20 cm   LV E/e' medial:  13.7 LV IVS:        1.30 cm   LV e' lateral:   5.44 cm/s LVOT diam:     2.30 cm   LV E/e' lateral: 6.9 LV SV:         60 LV SV Index:   27 LVOT Area:     4.15 cm  RIGHT VENTRICLE RV S prime:     9.79 cm/s TAPSE (M-mode): 1.3 cm LEFT ATRIUM             Index LA diam:        4.30 cm 1.98 cm/m LA Vol (A2C):   42.6 ml 19.58 ml/m LA Vol (A4C):   52.7 ml 24.22 ml/m LA Biplane Vol: 52.5 ml 24.13 ml/m  AORTIC VALVE LVOT Vmax:   64.00 cm/s LVOT Vmean:  47.100 cm/s LVOT VTI:    0.144 m  AORTA Ao Root diam: 3.50 cm MITRAL VALVE MV Area (PHT): 3.66 cm    SHUNTS MV Decel Time: 207 msec    Systemic VTI:  0.14 m MV E velocity: 37.30 cm/s  Systemic Diam: 2.30 cm MV A velocity: 92.50 cm/s MV E/A ratio:  0.40 Nona Dell MD Electronically signed by Nona Dell MD Signature Date/Time: 02/12/2023/3:57:13 PM    Final    MR BRAIN WO CONTRAST Result Date: 02/11/2023 CLINICAL DATA:  Stroke suspected EXAM: MRI HEAD WITHOUT CONTRAST TECHNIQUE: Multiplanar, multiecho pulse sequences of the brain and surrounding structures were obtained without intravenous contrast. COMPARISON:  02/11/2023 CT head and CTA head and neck, no prior MRI available FINDINGS: Brain: Restricted diffusion with ADC correlates in the left frontal and parietal cortex (series 5, images 17, 27-33), right posterior basal ganglia/corona radiata (series 5, images 23-24), left posterior caudate (series 5, image 20), left occipital lobe (series 5, images 17-20), compatible with acute to early subacute infarcts. No acute hemorrhage, mass, mass effect, or midline shift. No hydrocephalus or extra-axial collection. Pituitary and craniocervical junction within normal limits. Hemosiderin deposition is associated with some of the suspected acute infarcts (series 13, image 50 and 54), which may indicate petechial hemorrhage. 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 Advanced cerebral volume loss for age, most prominent in left greater than right frontal parietal lobes. Confluent T2 hyperintense signal in the periventricular white matter, likely the sequela of severe chronic small vessel ischemic disease. Vascular: Loss of the left vertebral artery flow void (series 10, image 1), which correlates with the occlusion seen on the same-day CTA. Otherwise normal arterial flow voids. Skull and upper cervical spine: Normal marrow signal. Sinuses/Orbits: Clear paranasal sinuses. No acute finding in the orbits. Right lens replacement. Other: The mastoid air cells are well aerated. IMPRESSION: 1. Acute to early subacute infarcts in the left frontal and parietal cortex, right posterior basal ganglia/corona radiata, left posterior caudate, and left occipital lobe. Hemosiderin deposition is associated with some of these infarcts, which may indicate petechial hemorrhage. 2. Loss of the left vertebral artery flow void, which correlates with the occlusion seen on the same-day CTA These results were called by telephone at the time of interpretation on 02/11/2023 at 6:19 pm to provider STACK, who verbally acknowledged these results. Electronically Signed   By: Wiliam Ke M.D.   On: 02/11/2023 18:20   CT ANGIO HEAD NECK W WO CM W PERF (CODE STROKE) Result Date: 02/11/2023 CLINICAL DATA:  Neuro deficit, acute, stroke suspected RUE flaccid paralysis EXAM: CT ANGIOGRAPHY HEAD AND NECK CT PERFUSION BRAIN TECHNIQUE: Multidetector CT imaging of the head and neck was performed using the standard protocol during bolus administration of intravenous contrast. Multiplanar CT image reconstructions and MIPs were obtained to evaluate the vascular anatomy. Carotid stenosis measurements (when applicable) are obtained utilizing NASCET criteria, using the distal internal  carotid diameter as the denominator. Multiphase CT imaging of the brain was performed following IV bolus contrast injection. Subsequent parametric perfusion maps were calculated using RAPID software. RADIATION DOSE REDUCTION: This exam was performed according to the departmental dose-optimization program which includes automated exposure control, adjustment of the mA and/or kV according to patient size and/or use of iterative reconstruction technique. CONTRAST:  OMNIPAQUE IOHEXOL 350 MG/ML SOLN COMPARISON:  None Available. FINDINGS: CTA NECK FINDINGS Aortic arch: Aortic atherosclerosis. Great vessel origins are patent without significant stenosis. Right carotid system: Atherosclerosis at the carotid bifurcation and involving the proximal ICA with proximally 50% stenosis of the proximal ICA. Left carotid system: Atherosclerosis at the carotid bifurcation and involving the proximal ICA without greater than 50% stenosis. Vertebral arteries: Right dominant. No evidence of dissection, stenosis (50% or greater) or occlusion in the neck. Skeleton: Other neck: Upper chest: Review of the MIP images confirms the above findings CTA HEAD FINDINGS Anterior circulation: Bilateral intracranial ICAs are patent with moderate left and mild right cavernous ICA narrowing. Bilateral MCAs and ACAs are patent without proximal hemodynamically significant stenosis. 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 Right intradural vertebral artery is patent with mild stenosis at its dural margin. Basilar artery and bilateral posterior cerebral arteries are patent without proximal hemodynamically significant stenosis. Venous sinuses: As permitted by contrast timing, patent. Review of the MIP images confirms the above findings CT Brain Perfusion Findings: ASPECTS: 9 CBF (<30%) Volume: 0mL Perfusion (Tmax>6.0s) volume: 0mL Mismatch Volume: 0mL Infarction Location:None identified. IMPRESSION:  CTA: 1. 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 2. 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 3. Approximately 50% stenosis of the proximal right ICA. 4.  Aortic Atherosclerosis (ICD10-I70.0). CT perfusion: No evidence of core infarct or penumbra. Preliminary results were communicated on 02/11/2023 at 2:26 pm  to provider Dr. Selina Cooley via secure text paging. Electronically Signed   By: Feliberto Harts M.D.   On: 02/11/2023 14:27   CT HEAD CODE STROKE WO CONTRAST Result Date: 02/11/2023 CLINICAL DATA:  Code stroke. Provided history: Neuro deficit, acute, stroke suspected. EXAM: CT HEAD WITHOUT CONTRAST TECHNIQUE: Contiguous axial images were obtained from the base of the skull through the vertex without intravenous contrast. RADIATION DOSE REDUCTION: This exam was performed according to the departmental dose-optimization program which includes automated exposure control, adjustment of the mA and/or kV according to patient size and/or use of iterative reconstruction technique. COMPARISON:  None. FINDINGS: Brain: Generalized parenchymal atrophy. Commensurate prominence of the ventricles and sulci Age-indeterminate lacunar infarcts within the left centrum semiovale, left caudate nucleus and bilateral corona radiata. Moderate patchy and ill-defined hypoattenuation within the cerebral white matter, nonspecific but compatible with chronic small vessel ischemic disease. There is no acute intracranial hemorrhage. No demarcated cortical infarct. No extra-axial fluid collection. No evidence of an intracranial mass. No midline shift. Vascular: No hyperdense vessel.  Atherosclerotic calcifications. Skull: No calvarial fracture or aggressive osseous lesion. Sinuses/Orbits: No mass or acute finding within the imaged orbits. No significant paranasal sinus disease. ASPECTS (Alberta Stroke Program Early CT Score) - Ganglionic level infarction (caudate, lentiform nuclei, internal capsule,  insula, M1-M3 cortex): 6 - Supraganglionic infarction (M4-M6 cortex): 3 Total score (0-10 with 10 being normal): 9 These results were called by telephone at the time of interpretation on 02/11/2023 at 1:52 pm to provider Gloris Manchester , who verbally acknowledged these results. IMPRESSION: 1. Age-indeterminate lacunar infarcts within the left centrum semiovale, left caudate nucleus and bilateral corona radiata. ASPECTS is 9. 2. Background parenchymal atrophy and chronic small vessel ischemic disease. Electronically Signed   By: Jackey Loge D.O.   On: 02/11/2023 13:54    Microbiology: Results for orders placed or performed during the hospital encounter of 07/02/19  Blood culture (routine x 2)     Status: None   Collection Time: 07/02/19  8:28 PM   Specimen: BLOOD  Result Value Ref Range Status   Specimen Description BLOOD  Final   Special Requests BOTTLES DRAWN AEROBIC AND ANAEROBIC  Final   Culture   Final    NO GROWTH 5 DAYS Performed at [facility], [address] [place], [place] [address]    Report Status 07/07/2019 FINAL  Final    Labs: CBC: Recent Labs  Lab 02/11/23 1340 02/11/23 1347  WBC 8.0  --   NEUTROABS 5.7  --   HGB 16.4 16.3  HCT 48.2 48.0  MCV 89.1  --   PLT 129*  --    Basic Metabolic Panel: Recent Labs  Lab 02/11/23 1340 02/11/23 1347  NA 136 141  K 3.8 3.9  CL 103 102  CO2 26  --   GLUCOSE 108* 105*  BUN 14 14  CREATININE 1.02 1.10  CALCIUM 8.9  --    Liver Function Tests: Recent Labs  Lab 02/11/23 1340  AST 21  ALT 17  ALKPHOS 104  BILITOT 0.8  PROT 6.8  ALBUMIN 3.9   CBG: Recent Labs  Lab 02/11/23 1324  GLUCAP 124*    Discharge time spent: greater than 30 minutes.  Signed: Tyrone Nine, MD [facility] 02/14/2023

## 2023-02-14 NOTE — TOC Transition Note (Signed)
Transition of Care [facility]) - Discharge Note   Patient Details  Name: Francisco Reid MRN: [id number] Date of Birth: [birth year]  Transition [facility]) CM/SW Contact:  Villa Herb, LCSWA Phone Number: 02/14/2023, 1:19 PM   Clinical Narrative:    CSW updated that pts insurance auth has been approved for SNF at [facility]. CSW spoke to Tammy with SNF who states they are able to accept pt today. CSW provided RN with room and report numbers. CSW sent D/C clinicals to facility via [facility]. CSW called for [facility] transport. CSW updated pts nephew [name] of plan for D/C, he will go to facility after work to meet pt. TOC signing off.   Final next level of care: Skilled Nursing Facility Barriers to Discharge: Barriers Resolved   Patient Goals and CMS Choice Patient states their goals for this hospitalization and ongoing recovery are:: Go to SNF CMS Medicare.gov Compare Post Acute Care list provided to:: Patient Choice offered to / list presented to : Patient [place] ownership interest in [facility].provided to:: Patient    Discharge Placement              Patient chooses bed at: [facility] Patient to be transferred to facility by: [facility] Name of family member notified: [name] Patient and family notified of of transfer: 02/14/23  Discharge Plan and Services Additional resources added to the After Visit Summary for   In-house Referral: Clinical Social Work Discharge Planning Services: CM Consult Post Acute Care Choice: Skilled Nursing Facility                               Social Drivers of Health (SDOH) Interventions SDOH Screenings   Food Insecurity: No Food Insecurity (02/11/2023)  Housing: Low Risk  (02/11/2023)  Transportation Needs: No Transportation Needs (02/11/2023)  Utilities: Not At Risk (02/11/2023)  Social Connections: Patient Declined (02/11/2023)  Tobacco Use: Medium Risk (02/11/2023)     Readmission Risk Interventions     No  data to display

## 2023-02-14 NOTE — Care Management Important Message (Signed)
Important Message  Patient Details  Name: Francisco Reid MRN: [id number] Date of Birth: December 01, 1940   Important Message Given:  N/A - LOS <3 / Initial given by admissions     Corey Harold 02/14/2023, 10:05 AM

## 2023-02-14 NOTE — Progress Notes (Signed)
Pt rested well. This RN was able to assist him to restroom with walker. He remains weak on R side but seems more mobile this AM. No acute events overnight. Francisco Reid

## 2023-02-15 ENCOUNTER — Telehealth: Payer: Self-pay | Admitting: *Deleted

## 2023-02-15 DIAGNOSIS — I639 Cerebral infarction, unspecified: Secondary | ICD-10-CM

## 2023-02-15 NOTE — Telephone Encounter (Signed)
Pt enrolled in [employer] and order placed.  

## 2023-02-15 NOTE — Telephone Encounter (Signed)
-----   Message from Ellsworth Lennox sent at 02/14/2023  7:20 PM EST ----- Regarding: 30-day monitor I received a message from Dr. Jarvis Newcomer [facility]) that this patient needs a 30-day monitor for CVA. Was discharged to SNF ([facility] in [place]) so it might be worth checking with them first to make sure they will apply this for the patient.   Thanks,  [place]

## 2023-03-09 ENCOUNTER — Inpatient Hospital Stay: Payer: Medicare Other | Admitting: Radiology

## 2023-03-09 ENCOUNTER — Emergency Department: Payer: Medicare Other

## 2023-03-09 ENCOUNTER — Inpatient Hospital Stay
Admission: EM | Admit: 2023-03-09 | Discharge: 2023-03-19 | DRG: 871 | Disposition: A | Discharge status: Skilled Nursing Facility | Payer: Medicare Other | Attending: Hospitalist | Admitting: Hospitalist

## 2023-03-09 ENCOUNTER — Other Ambulatory Visit: Payer: Self-pay

## 2023-03-09 DIAGNOSIS — R310 Gross hematuria: Secondary | ICD-10-CM | POA: Diagnosis not present

## 2023-03-09 DIAGNOSIS — E872 Acidosis, unspecified: Secondary | ICD-10-CM | POA: Diagnosis present

## 2023-03-09 DIAGNOSIS — K8000 Calculus of gallbladder with acute cholecystitis without obstruction: Secondary | ICD-10-CM | POA: Diagnosis present

## 2023-03-09 DIAGNOSIS — E876 Hypokalemia: Secondary | ICD-10-CM | POA: Insufficient documentation

## 2023-03-09 DIAGNOSIS — K750 Abscess of liver: Secondary | ICD-10-CM | POA: Diagnosis present

## 2023-03-09 DIAGNOSIS — E785 Hyperlipidemia, unspecified: Secondary | ICD-10-CM | POA: Insufficient documentation

## 2023-03-09 DIAGNOSIS — N400 Enlarged prostate without lower urinary tract symptoms: Secondary | ICD-10-CM | POA: Diagnosis present

## 2023-03-09 DIAGNOSIS — J9601 Acute respiratory failure with hypoxia: Secondary | ICD-10-CM | POA: Diagnosis not present

## 2023-03-09 DIAGNOSIS — I110 Hypertensive heart disease with heart failure: Secondary | ICD-10-CM | POA: Diagnosis present

## 2023-03-09 DIAGNOSIS — E119 Type 2 diabetes mellitus without complications: Secondary | ICD-10-CM | POA: Diagnosis present

## 2023-03-09 DIAGNOSIS — K82A1 Gangrene of gallbladder in cholecystitis: Secondary | ICD-10-CM | POA: Diagnosis present

## 2023-03-09 DIAGNOSIS — J189 Pneumonia, unspecified organism: Secondary | ICD-10-CM | POA: Insufficient documentation

## 2023-03-09 DIAGNOSIS — A419 Sepsis, unspecified organism: Secondary | ICD-10-CM | POA: Diagnosis present

## 2023-03-09 DIAGNOSIS — Z8249 Family history of ischemic heart disease and other diseases of the circulatory system: Secondary | ICD-10-CM

## 2023-03-09 DIAGNOSIS — K810 Acute cholecystitis: Secondary | ICD-10-CM

## 2023-03-09 DIAGNOSIS — N1330 Unspecified hydronephrosis: Secondary | ICD-10-CM | POA: Diagnosis present

## 2023-03-09 DIAGNOSIS — Z955 Presence of coronary angioplasty implant and graft: Secondary | ICD-10-CM

## 2023-03-09 DIAGNOSIS — I5043 Acute on chronic combined systolic (congestive) and diastolic (congestive) heart failure: Secondary | ICD-10-CM | POA: Diagnosis not present

## 2023-03-09 DIAGNOSIS — I10 Essential (primary) hypertension: Secondary | ICD-10-CM | POA: Insufficient documentation

## 2023-03-09 DIAGNOSIS — R7989 Other specified abnormal findings of blood chemistry: Secondary | ICD-10-CM | POA: Insufficient documentation

## 2023-03-09 DIAGNOSIS — Z8673 Personal history of transient ischemic attack (TIA), and cerebral infarction without residual deficits: Secondary | ICD-10-CM

## 2023-03-09 DIAGNOSIS — B961 Klebsiella pneumoniae [K. pneumoniae] as the cause of diseases classified elsewhere: Secondary | ICD-10-CM | POA: Diagnosis present

## 2023-03-09 DIAGNOSIS — Z7982 Long term (current) use of aspirin: Secondary | ICD-10-CM | POA: Diagnosis not present

## 2023-03-09 DIAGNOSIS — I639 Cerebral infarction, unspecified: Secondary | ICD-10-CM | POA: Diagnosis present

## 2023-03-09 DIAGNOSIS — Z79899 Other long term (current) drug therapy: Secondary | ICD-10-CM

## 2023-03-09 DIAGNOSIS — Z881 Allergy status to other antibiotic agents status: Secondary | ICD-10-CM

## 2023-03-09 DIAGNOSIS — N320 Bladder-neck obstruction: Secondary | ICD-10-CM | POA: Diagnosis present

## 2023-03-09 DIAGNOSIS — I2510 Atherosclerotic heart disease of native coronary artery without angina pectoris: Secondary | ICD-10-CM

## 2023-03-09 DIAGNOSIS — R338 Other retention of urine: Secondary | ICD-10-CM | POA: Diagnosis present

## 2023-03-09 DIAGNOSIS — K82A2 Perforation of gallbladder in cholecystitis: Secondary | ICD-10-CM | POA: Diagnosis present

## 2023-03-09 DIAGNOSIS — R748 Abnormal levels of other serum enzymes: Secondary | ICD-10-CM | POA: Insufficient documentation

## 2023-03-09 DIAGNOSIS — M6282 Rhabdomyolysis: Secondary | ICD-10-CM | POA: Diagnosis present

## 2023-03-09 DIAGNOSIS — E663 Overweight: Secondary | ICD-10-CM | POA: Insufficient documentation

## 2023-03-09 DIAGNOSIS — E7800 Pure hypercholesterolemia, unspecified: Secondary | ICD-10-CM | POA: Diagnosis present

## 2023-03-09 DIAGNOSIS — Z85820 Personal history of malignant melanoma of skin: Secondary | ICD-10-CM | POA: Diagnosis not present

## 2023-03-09 DIAGNOSIS — R6520 Severe sepsis without septic shock: Secondary | ICD-10-CM | POA: Diagnosis present

## 2023-03-09 DIAGNOSIS — Z87891 Personal history of nicotine dependence: Secondary | ICD-10-CM

## 2023-03-09 DIAGNOSIS — Z7902 Long term (current) use of antithrombotics/antiplatelets: Secondary | ICD-10-CM

## 2023-03-09 HISTORY — PX: IR PERC CHOLECYSTOSTOMY: IMG2326

## 2023-03-09 LAB — CBC
HCT: 32.6 % — ABNORMAL LOW (ref 39.0–52.0)
Hemoglobin: 10.5 g/dL — ABNORMAL LOW (ref 13.0–17.0)
MCH: 29.6 pg (ref 26.0–34.0)
MCHC: 32.2 g/dL (ref 30.0–36.0)
MCV: 91.8 fL (ref 80.0–100.0)
Platelets: 97 10*3/uL — ABNORMAL LOW (ref 150–400)
RBC: 3.55 MIL/uL — ABNORMAL LOW (ref 4.22–5.81)
RDW: 13.6 % (ref 11.5–15.5)
WBC: 14.7 10*3/uL — ABNORMAL HIGH (ref 4.0–10.5)
nRBC: 0 % (ref 0.0–0.2)

## 2023-03-09 LAB — BASIC METABOLIC PANEL
Anion gap: 11 (ref 5–15)
BUN: 31 mg/dL — ABNORMAL HIGH (ref 8–23)
CO2: 25 mmol/L (ref 22–32)
Calcium: 6.9 mg/dL — ABNORMAL LOW (ref 8.9–10.3)
Chloride: 106 mmol/L (ref 98–111)
Creatinine, Ser: 0.98 mg/dL (ref 0.61–1.24)
GFR, Estimated: >60 mL/min (ref >60)
Glucose, Bld: 150 mg/dL — ABNORMAL HIGH (ref 70–99)
Potassium: 2.2 mmol/L — CL (ref 3.5–5.1)
Sodium: 142 mmol/L (ref 135–145)

## 2023-03-09 LAB — RESP PANEL BY RT-PCR (RSV, FLU A&B, COVID)  RVPGX2
Influenza A by PCR: NEGATIVE
Influenza B by PCR: NEGATIVE
Resp Syncytial Virus by PCR: NEGATIVE
SARS Coronavirus 2 by RT PCR: NEGATIVE

## 2023-03-09 LAB — URINALYSIS, ROUTINE W REFLEX MICROSCOPIC
Bilirubin Urine: NEGATIVE
Glucose, UA: NEGATIVE mg/dL
Ketones, ur: NEGATIVE mg/dL
Nitrite: NEGATIVE
Protein, ur: 30 mg/dL — AB
Specific Gravity, Urine: 1.016 (ref 1.005–1.030)
pH: 5 (ref 5.0–8.0)

## 2023-03-09 LAB — HEPATIC FUNCTION PANEL
ALT: 53 U/L — ABNORMAL HIGH (ref 0–44)
AST: 75 U/L — ABNORMAL HIGH (ref 15–41)
Albumin: 3.1 g/dL — ABNORMAL LOW (ref 3.5–5.0)
Alkaline Phosphatase: 110 U/L (ref 38–126)
Bilirubin, Direct: 0.8 mg/dL — ABNORMAL HIGH (ref 0.0–0.2)
Indirect Bilirubin: 1.1 mg/dL — ABNORMAL HIGH (ref 0.3–0.9)
Total Bilirubin: 1.9 mg/dL — ABNORMAL HIGH (ref 0.0–1.2)
Total Protein: 7 g/dL (ref 6.5–8.1)

## 2023-03-09 LAB — LACTIC ACID, PLASMA
Lactic Acid, Venous: 2.6 mmol/L (ref 0.5–1.9)
Lactic Acid, Venous: 3 mmol/L (ref 0.5–1.9)

## 2023-03-09 LAB — MAGNESIUM: Magnesium: 2.3 mg/dL (ref 1.7–2.4)

## 2023-03-09 LAB — LIPASE, BLOOD: Lipase: 27 U/L (ref 11–51)

## 2023-03-09 LAB — CK: Total CK: 1525 U/L — ABNORMAL HIGH (ref 49–397)

## 2023-03-09 LAB — PROCALCITONIN: Procalcitonin: 6.08 ng/mL

## 2023-03-09 LAB — TROPONIN I (HIGH SENSITIVITY)
Troponin I (High Sensitivity): 51 ng/L — ABNORMAL HIGH (ref <18)
Troponin I (High Sensitivity): 60 ng/L — ABNORMAL HIGH (ref <18)

## 2023-03-09 LAB — BRAIN NATRIURETIC PEPTIDE: B Natriuretic Peptide: 183.5 pg/mL — ABNORMAL HIGH (ref 0.0–100.0)

## 2023-03-09 LAB — ETHANOL: Alcohol, Ethyl (B): <10 mg/dL (ref <10)

## 2023-03-09 MED ORDER — HYDRALAZINE HCL 20 MG/ML IJ SOLN: 5.0000 mg | Freq: Four times a day (QID) | INTRAMUSCULAR | Status: AC | PRN

## 2023-03-09 MED ORDER — PIPERACILLIN-TAZOBACTAM 3.375 G IVPB 30 MIN
3.3750 g | Freq: Once | INTRAVENOUS | Status: AC
  Administered 2023-03-09: 3.375 g via INTRAVENOUS
  Filled 2023-03-09 (×2): qty 50

## 2023-03-09 MED ORDER — VANCOMYCIN HCL 2000 MG/400ML IV SOLN
2000.0000 mg | INTRAVENOUS | Status: DC
  Filled 2023-03-09: qty 400

## 2023-03-09 MED ORDER — MIDAZOLAM HCL 2 MG/2ML IJ SOLN
INTRAMUSCULAR | Status: AC
  Filled 2023-03-09: qty 2

## 2023-03-09 MED ORDER — ONDANSETRON HCL 4 MG/2ML IJ SOLN
4.0000 mg | Freq: Four times a day (QID) | INTRAMUSCULAR | Status: DC | PRN
  Administered 2023-03-09 – 2023-03-11 (×2): 4 mg via INTRAVENOUS
  Filled 2023-03-09 (×2): qty 2

## 2023-03-09 MED ORDER — VANCOMYCIN HCL IN DEXTROSE 1-5 GM/200ML-% IV SOLN
1000.0000 mg | Freq: Once | INTRAVENOUS | Status: AC
  Administered 2023-03-09: 1000 mg via INTRAVENOUS
  Filled 2023-03-09: qty 200

## 2023-03-09 MED ORDER — POTASSIUM CHLORIDE 10 MEQ/100ML IV SOLN
10.0000 meq | INTRAVENOUS | Status: AC
  Administered 2023-03-09 (×5): 10 meq via INTRAVENOUS
  Filled 2023-03-09 (×5): qty 100

## 2023-03-09 MED ORDER — PIPERACILLIN-TAZOBACTAM 3.375 G IVPB
3.3750 g | Freq: Three times a day (TID) | INTRAVENOUS | Status: DC
  Administered 2023-03-09 – 2023-03-12 (×7): 3.375 g via INTRAVENOUS
  Filled 2023-03-09 (×8): qty 50

## 2023-03-09 MED ORDER — SODIUM CHLORIDE 0.9 % IV SOLN
500.0000 mg | INTRAVENOUS | Status: DC
  Administered 2023-03-09: 500 mg via INTRAVENOUS
  Filled 2023-03-09: qty 5

## 2023-03-09 MED ORDER — LACTATED RINGERS IV SOLN
150.0000 mL/h | INTRAVENOUS | Status: DC
  Administered 2023-03-09: 150 mL/h via INTRAVENOUS

## 2023-03-09 MED ORDER — ONDANSETRON HCL 4 MG PO TABS: 4.0000 mg | ORAL_TABLET | Freq: Four times a day (QID) | ORAL | Status: DC | PRN

## 2023-03-09 MED ORDER — LACTATED RINGERS IV BOLUS
1000.0000 mL | Freq: Once | INTRAVENOUS | Status: AC
  Administered 2023-03-09: 1000 mL via INTRAVENOUS

## 2023-03-09 MED ORDER — ACETAMINOPHEN 650 MG RE SUPP: 650.0000 mg | Freq: Four times a day (QID) | RECTAL | Status: DC | PRN

## 2023-03-09 MED ORDER — METRONIDAZOLE 500 MG/100ML IV SOLN: 500.0000 mg | Freq: Two times a day (BID) | INTRAVENOUS | Status: DC

## 2023-03-09 MED ORDER — SODIUM CHLORIDE 0.9% FLUSH
5.0000 mL | Freq: Three times a day (TID) | INTRAVENOUS | Status: DC
  Administered 2023-03-09 – 2023-03-19 (×29): 5 mL

## 2023-03-09 MED ORDER — ACETAMINOPHEN 325 MG PO TABS
650.0000 mg | ORAL_TABLET | Freq: Four times a day (QID) | ORAL | Status: DC | PRN
  Administered 2023-03-17: 650 mg via ORAL
  Filled 2023-03-09: qty 2

## 2023-03-09 MED ORDER — MORPHINE SULFATE (PF) 4 MG/ML IV SOLN
4.0000 mg | INTRAVENOUS | Status: AC | PRN
  Administered 2023-03-09: 4 mg via INTRAVENOUS
  Filled 2023-03-09: qty 1

## 2023-03-09 MED ORDER — FENTANYL CITRATE (PF) 100 MCG/2ML IJ SOLN
INTRAMUSCULAR | Status: AC | PRN
  Administered 2023-03-09: 50 ug via INTRAVENOUS

## 2023-03-09 MED ORDER — FENTANYL CITRATE (PF) 100 MCG/2ML IJ SOLN
INTRAMUSCULAR | Status: AC
  Filled 2023-03-09: qty 2

## 2023-03-09 MED ORDER — POTASSIUM CHLORIDE CRYS ER 20 MEQ PO TBCR
40.0000 meq | EXTENDED_RELEASE_TABLET | Freq: Every day | ORAL | Status: DC
  Filled 2023-03-09: qty 2

## 2023-03-09 MED ORDER — ATORVASTATIN CALCIUM 20 MG PO TABS: 40.0000 mg | ORAL_TABLET | Freq: Every day | ORAL | Status: DC

## 2023-03-09 MED ORDER — MIDAZOLAM HCL 2 MG/2ML IJ SOLN
INTRAMUSCULAR | Status: AC | PRN
  Administered 2023-03-09: 1 mg via INTRAVENOUS

## 2023-03-09 MED ORDER — LIDOCAINE HCL 1 % IJ SOLN
INTRAMUSCULAR | Status: AC
  Filled 2023-03-09: qty 20

## 2023-03-09 MED ORDER — MORPHINE SULFATE (PF) 2 MG/ML IV SOLN: 2.0000 mg | INTRAVENOUS | Status: AC | PRN

## 2023-03-09 MED ORDER — IOHEXOL 300 MG/ML  SOLN
100.0000 mL | Freq: Once | INTRAMUSCULAR | Status: AC | PRN
  Administered 2023-03-09: 100 mL via INTRAVENOUS

## 2023-03-09 MED ORDER — IOHEXOL 300 MG/ML  SOLN
10.0000 mL | Freq: Once | INTRAMUSCULAR | Status: AC | PRN
  Administered 2023-03-09: 5 mL

## 2023-03-09 MED ORDER — LIDOCAINE HCL (PF) 1 % IJ SOLN
7.0000 mL | Freq: Once | INTRAMUSCULAR | Status: AC
  Administered 2023-03-09: 7 mL via INTRADERMAL

## 2023-03-09 NOTE — ED Notes (Signed)
 Felipe Horton, MD, made aware of potassium 2.2 and lactic 2.6

## 2023-03-09 NOTE — ED Provider Notes (Signed)
 [facility] Provider Note    Event Date/Time   First MD Initiated Contact with Patient 03/09/23 1504     (approximate)   History   No chief complaint on file.   HPI  Francisco Reid is a 83 y.o. male who presents to the ED for evaluation of No chief complaint on file.   Review a medical DC summary from a few weeks ago.  Admitted for stroke, multiple lacunar infarcts.  DAPT with aspirin  and Plavix .  CAD with remote PCI, mildly reduced EF, DM, HTN.  Some documented acute delirium alongside the stroke, some suspicion for more chronic cognitive impairment.  Patient presents to the ED from home via EMS for evaluation of generalized weakness, falls or syncope.   Patient lives at home alone but has a home geneticist, molecular, friend on the street to check in on him consistently.  He presents today with his nephew who is his POA.  Nephew last saw him yesterday evening, reports finding the patient on the floor and uncertain what happened.  Patient refused transport to the ED last night.   Today, patient has become increasingly weak.  Apparently had another fall or syncopal episode (found down).  Wet cough today.  Patient reports feeling unwell but cannot further describe his symptoms. Subjective fevers.   Nephew also reports patient seems to have a distended abdomen.  They found blood in his briefs today.  Physical Exam   Triage Vital Signs: ED Triage Vitals  Encounter Vitals Group     BP 03/09/23 1410 136/74     Systolic BP Percentile --      Diastolic BP Percentile --      Pulse Rate 03/09/23 1410 74     Resp 03/09/23 1410 (!) 26     Temp 03/09/23 1410 98.4 F (36.9 C)     Temp Source 03/09/23 1410 Oral     SpO2 03/09/23 1410 96 %     Weight --      Height --      Head Circumference --      Peak Flow --      Pain Score 03/09/23 1411 0     Pain Loc --      Pain Education --      Exclude from Growth Chart --     Most recent vital signs: Vitals:   03/09/23  1845 03/09/23 1850  BP: 135/64 119/65  Pulse: 89 81  Resp: 15 17  Temp:    SpO2: 96% 96%    General: Awake.  Looks unwell.  Able to provide history and follow some simple commands.  Requires 2 person assist to sit upright so I can examine his back. CV:  Good peripheral perfusion.  Resp:  Tachypnea to the mid 20s without distress. Abd:  Mild distended, diffusely tender.  MSK:  No deformity noted.  Neuro:  No focal deficits appreciated. Other:     ED Results / Procedures / Treatments   Labs (all labs ordered are listed, but only abnormal results are displayed) Labs Reviewed  BASIC METABOLIC PANEL - Abnormal; Notable for the following components:      Result Value   Potassium 2.2 (*)    Glucose, Bld 150 (*)    BUN 31 (*)    Calcium  6.9 (*)    All other components within normal limits  CBC - Abnormal; Notable for the following components:   WBC 14.7 (*)    RBC 3.55 (*)  Hemoglobin 10.5 (*)    HCT 32.6 (*)    Platelets 97 (*)    All other components within normal limits  LACTIC ACID, PLASMA - Abnormal; Notable for the following components:   Lactic Acid, Venous 2.6 (*)    All other components within normal limits  LACTIC ACID, PLASMA - Abnormal; Notable for the following components:   Lactic Acid, Venous 3.0 (*)    All other components within normal limits  HEPATIC FUNCTION PANEL - Abnormal; Notable for the following components:   Albumin 3.1 (*)    AST 75 (*)    ALT 53 (*)    Total Bilirubin 1.9 (*)    Bilirubin, Direct 0.8 (*)    Indirect Bilirubin 1.1 (*)    All other components within normal limits  CK - Abnormal; Notable for the following components:   Total CK 1,525 (*)    All other components within normal limits  BRAIN NATRIURETIC PEPTIDE - Abnormal; Notable for the following components:   B Natriuretic Peptide 183.5 (*)    All other components within normal limits  URINALYSIS, ROUTINE W REFLEX MICROSCOPIC - Abnormal; Notable for the following components:    Color, Urine AMBER (*)    APPearance HAZY (*)    Hgb urine dipstick MODERATE (*)    Protein, ur 30 (*)    Leukocytes,Ua TRACE (*)    Bacteria, UA RARE (*)    All other components within normal limits  TROPONIN I (HIGH SENSITIVITY) - Abnormal; Notable for the following components:   Troponin I (High Sensitivity) 51 (*)    All other components within normal limits  TROPONIN I (HIGH SENSITIVITY) - Abnormal; Notable for the following components:   Troponin I (High Sensitivity) 60 (*)    All other components within normal limits  RESP PANEL BY RT-PCR (RSV, FLU A&B, COVID)  RVPGX2  CULTURE, BLOOD (ROUTINE X 2)  CULTURE, BLOOD (ROUTINE X 2)  URINE CULTURE  ETHANOL  PROCALCITONIN  MAGNESIUM  LIPASE, BLOOD  PROTIME-INR  BASIC METABOLIC PANEL  CBC  PROCALCITONIN  CK  HEPATIC FUNCTION PANEL    EKG Sinus rhythm with a rate of 86 bpm.  Leftward axis.  Incomplete right bundle.  No clear acute ischemic features.  RADIOLOGY 2 view CXR interpreted by me with bibasilar streaky opacities CT abdomen/pelvis interpreted by me with distended urinary bladder and signs of cholecystitis with perforation CT head interpreted by me without evidence of acute intracranial pathology  Official radiology report(s): CT ABDOMEN PELVIS W CONTRAST Result Date: 03/09/2023 CLINICAL DATA:  Fever and abdominal tendinous. EXAM: CT ABDOMEN AND PELVIS WITH CONTRAST TECHNIQUE: Multidetector CT imaging of the abdomen and pelvis was performed using the standard protocol following bolus administration of intravenous contrast. RADIATION DOSE REDUCTION: This exam was performed according to the departmental dose-optimization program which includes automated exposure control, adjustment of the mA and/or kV according to patient size and/or use of iterative reconstruction technique. CONTRAST:  OMNIPAQUE  IOHEXOL  300 MG/ML  SOLN COMPARISON:  CT abdomen pelvis dated February 24, 2018. FINDINGS: Lower chest: No acute  abnormality.  Bibasilar atelectasis. Hepatobiliary: Gallbladder with mild irregular wall thickening and suspected wall discontinuity at the fundus. Small gallstone seen near the gallbladder neck. 2.8 x 3.4 cm irregular low-density lesion with subtle peripheral enhancement in the inferior liver adjacent to the gallbladder fundus (series 3, image 26). Pericholecystic inflammatory change near the gallbladder neck. No other focal liver abnormality. No biliary dilatation. Pancreas: Unremarkable. No pancreatic ductal dilatation or surrounding inflammatory  changes. Spleen: Normal in size without focal abnormality. Adrenals/Urinary Tract: 1 cm left adrenal nodule unchanged since 2020, consistent with benign adenoma. Insert follow-up normal right adrenal gland. No renal calculi or mass. Mild bilateral hydroureteronephrosis with markedly distended bladder. Stomach/Bowel: Stomach is within normal limits. Appendix appears normal. No evidence of bowel wall thickening, distention, or inflammatory changes. Diffuse colonic diverticulosis. Vascular/Lymphatic: Aortic atherosclerosis. No enlarged abdominal or pelvic lymph nodes. Reproductive: Mild prostatomegaly with TURP defect noted. Other: No abdominal wall hernia or abnormality. No abdominopelvic ascites. No pneumoperitoneum. Musculoskeletal: No acute or significant osseous findings. IMPRESSION: 1. Acute cholecystitis with probable gallbladder perforation at the fundus. 3.4 cm irregular low-density lesion with subtle peripheral enhancement in the inferior liver adjacent to the gallbladder fundus, concerning for developing hepatic abscess. 2. Mild bilateral hydroureteronephrosis with markedly distended bladder, consistent with bladder outlet obstruction 3.  Aortic Atherosclerosis (ICD10-I70.0). Electronically Signed   By: Elsie ONEIDA Shoulder M.D.   On: 03/09/2023 16:24   CT HEAD WO CONTRAST ( ) Result Date: 03/09/2023 CLINICAL DATA:  Recent severe. Evaluate for intracranial  hemorrhage. EXAM: CT HEAD WITHOUT CONTRAST TECHNIQUE: Contiguous axial images were obtained from the base of the skull through the vertex without intravenous contrast. RADIATION DOSE REDUCTION: This exam was performed according to the departmental dose-optimization program which includes automated exposure control, adjustment of the mA and/or kV according to patient size and/or use of iterative reconstruction technique. COMPARISON:  Head CT dated 02/11/2023. FINDINGS: Brain: Moderate age-related atrophy and chronic microvascular ischemic changes. Old appearing small right periventricular lacunar infarct. There is no acute intracranial hemorrhage. No mass effect or midline shift. No extra-axial fluid collection. Vascular: No hyperdense vessel or unexpected calcification. Skull: Normal. Negative for fracture or focal lesion. Sinuses/Orbits: No acute finding. Other: None IMPRESSION: 1. No acute intracranial pathology. 2. Moderate age-related atrophy and chronic microvascular ischemic changes. Electronically Signed   By: Vanetta Chou M.D.   On: 03/09/2023 16:16   DG Chest 2 View Result Date: 03/09/2023 CLINICAL DATA:  Dyspnea EXAM: CHEST - 2 VIEW COMPARISON:  08/01/2010 chest radiograph. FINDINGS: Stable cardiomediastinal silhouette with mild cardiomegaly. No pneumothorax. No pleural effusion. No overt pulmonary edema. Streaky and patchy mild bibasilar lung opacities. IMPRESSION: 1. Mild cardiomegaly. No overt pulmonary edema. 2. Streaky and patchy mild bibasilar lung opacities, favor atelectasis, difficult to exclude a component of aspiration or pneumonia. Electronically Signed   By: Selinda DELENA Blue M.D.   On: 03/09/2023 15:02    PROCEDURES and INTERVENTIONS:  .1-3 Lead EKG Interpretation  Performed by: Claudene Rover, MD Authorized by: Claudene Rover, MD     Interpretation: normal     ECG rate:  76   ECG rate assessment: normal     Rhythm: sinus rhythm     Ectopy: none     Conduction: normal   .Critical  Care  Performed by: Claudene Rover, MD Authorized by: Claudene Rover, MD   Critical care provider statement:    Critical care time (minutes):  75   Critical care time was exclusive of:  Separately billable procedures and treating other patients   Critical care was time spent personally by me on the following activities:  Development of treatment plan with patient or surrogate, discussions with consultants, evaluation of patient's response to treatment, examination of patient, ordering and review of laboratory studies, ordering and review of radiographic studies, ordering and performing treatments and interventions, pulse oximetry, re-evaluation of patient's condition and review of old charts   Medications  potassium chloride  10 mEq in 100 mL  IVPB (10 mEq Intravenous New Bag/Given 03/09/23 1743)  lactated ringers  infusion (has no administration in time range)  acetaminophen  (TYLENOL ) tablet 650 mg (has no administration in time range)    Or  acetaminophen  (TYLENOL ) suppository 650 mg (has no administration in time range)  ondansetron  (ZOFRAN ) tablet 4 mg (has no administration in time range)    Or  ondansetron  (ZOFRAN ) injection 4 mg (has no administration in time range)  hydrALAZINE  (APRESOLINE ) injection 5 mg (has no administration in time range)  morphine  (PF) 2 MG/ML injection 2 mg (has no administration in time range)  morphine  (PF) 4 MG/ML injection 4 mg (has no administration in time range)  potassium chloride  SA (KLOR-CON  M) CR tablet 40 mEq (has no administration in time range)  azithromycin  (ZITHROMAX ) 500 mg in sodium chloride  0.9 % 250 mL IVPB (has no administration in time range)  lactated ringers  bolus 1,000 mL (has no administration in time range)  piperacillin -tazobactam (ZOSYN ) IVPB 3.375 g (has no administration in time range)  fentaNYL  (SUBLIMAZE ) injection (50 mcg Intravenous Given 03/09/23 1844)  midazolam  (VERSED ) injection (1 mg Intravenous Given 03/09/23 1845)  lidocaine  (PF)  (XYLOCAINE ) 1 % injection 7 mL (has no administration in time range)  iohexol  (OMNIPAQUE ) 300 MG/ML solution 10 mL (has no administration in time range)  lactated ringers  bolus 1,000 mL (1,000 mLs Intravenous New Bag/Given 03/09/23 1624)  iohexol  (OMNIPAQUE ) 300 MG/ML solution 100 mL (100 mLs Intravenous Contrast Given 03/09/23 1552)  vancomycin  (VANCOCIN ) IVPB 1000 mg/200 mL premix (1,000 mg Intravenous New Bag/Given 03/09/23 1658)  piperacillin -tazobactam (ZOSYN ) IVPB 3.375 g (3.375 g Intravenous New Bag/Given 03/09/23 1818)     IMPRESSION / MDM / ASSESSMENT AND PLAN / ED COURSE  I reviewed the triage vital signs and the nursing notes.  Differential diagnosis includes, but is not limited to, hemorrhagic conversion/ICH, sepsis, pneumonia, rhabdomyolysis, renal failure, blood loss anemia, SBO  {Patient presents with symptoms of an acute illness or injury that is potentially life-threatening.  Patient who had a recent stroke presents from home with signs of sepsis from perforated cholecystitis requiring urgent IR cystostomy tube.  Essentially normal vital signs, intermittent tachypnea but remained stable without signs of shock.  Meeting sepsis criteria, severe with elevated lactic acid.  Mildly elevated CK.  Hypokalemia and initiate replacement IV.  As below, multiple conversations with both general surgery and IR, as well as hospitalist..   Patient and family are updated on plan of care and agreeable.  Clinical Course as of 03/09/23 1854  Sat Mar 09, 2023  1616 Significantly distended urinary bladder noted on the CT.  Updated nurse on need for a Foley catheter [DS]  1634 CT noted with signs of perforated cholecystitis.  I return to the bedside and discussed with patient and his nephew.  We discussed severity of situation, possibly life-threatening.  We discussed conversation with surgery for possible intervention but also the route of comfort measures and palliation and what that would look like.   He seems to express understanding of the situation and its severity.  He would like us  to go and talk to surgery and possibly go forward with surgery if it is an option.  I have general surgery paged. [DS]  8343 I consult with Dr. Desiderio. Recommends IR drain.  [DS]  1709 Dr. Karalee, IR. He will review case and get back to me shortly [DS]  1728 I talk with Dr. Karalee. Plan to go to the OR this evening. I update patient of this, expresses understanding  and agreement [DS]  1750 Consult with medicine who agrees to admit [DS]    Clinical Course User Index [DS] Claudene Rover, MD     FINAL CLINICAL IMPRESSION(S) / ED DIAGNOSES   Final diagnoses:  Cholecystitis with perforation of gallbladder  Hypokalemia     Rx / DC Orders   ED Discharge Orders     None        Note:  This document was prepared using Dragon voice recognition software and may include unintentional dictation errors.   Claudene Rover, MD 03/09/23 [id number]

## 2023-03-09 NOTE — Assessment & Plan Note (Addendum)
 Amlodipine  10 mg daily not resumed on admission as patient is meeting criteria for severe sepsis; AM team to resume when the benefits outweigh the risk Hydralazine  5 mg IV every 6 hours as needed for SBP greater 175, 6 days ordered

## 2023-03-09 NOTE — H&P (Signed)
 History and Physical   Francisco Reid FMW:[id number] DOB: 10-02-1940 DOA: 03/09/2023  PCP: Dr. Alla Outpatient Specialists: Dr. Florencio [facility] cardiology Patient coming from: Home via EMS  I have personally briefly reviewed patient's old medical records in [facility] EMR.  Chief Concern: Abdominal pain, fever, weakness  HPI: [name] is an 83 year old male with history of recent left lacunar infarct, history of CAD status post DES in 2014 currently on Plavix , chronic sinus bradycardia, history of dysphagia status post dilatation per [facility] GI, gastritis, hypercholesterolemia, hypertension, thyroid  nodule, who presents to the emergency department for chief concerns of abdominal pain, fever, altered mental status.  Patient has been too weak and unable to ambulate for EMS.  Vitals in the ED showed temperature of 98.4, respiration rate 26, heart rate 74, blood pressure 136/74, SpO2 96% on 3 L nasal cannula.  Serum sodium is 142, potassium 2.2, chloride 106, bicarb 25, BUN of 31, serum creatinine 0.98, EGFR greater than 60, nonfasting blood glucose 150, WBC 14.7, hemoglobin 10.5, platelets of 97.  BNP was elevated 183.5.  CK elevated at 1525.-C troponin is 60.  Procalcitonin 6.08.  Lactic acid elevated at 2.6 and on repeat was 3.0.  Magnesium level was within normal limits at 2.3.  ED treatment: LR 1 L bolus, Zosyn , vancomycin  per pharmacy, potassium chloride  10 mill equivalent IV, 5 doses ordered. --------------------------------------- At bedside, patient is sleeping, and arousable with sternal rub and loud verbal command.  Patient is status post percutaneous cholecystostomy tube placement with 50 of fentanyl  and 1 of Versed .  Patient does not appear to be in acute distress, and responding appropriately to pain medications given prior to procedure.  Social history: he lives at home.  ROS: Unable to complete due to patient postop recovery  ED Course: Discussed with  EDP, patient requiring hospitalization for chief concerns of possible sepsis secondary to acute cholecystitis and pneumonia.  Assessment/Plan  Principal Problem:   Severe sepsis with acute organ dysfunction (HCC) Active Problems:   Acute cholecystitis   CVA (cerebral vascular accident) (HCC)   Hypokalemia   CAD S/P percutaneous coronary angioplasty   Essential hypertension   Hyperlipidemia   Elevated LFTs   Elevated CK   CAP (community acquired pneumonia)   Cholecystitis with perforation of gallbladder   Assessment and Plan:  * Severe sepsis with acute organ dysfunction (HCC) Blood cultures x 2 are in process Urine culture in process Patient has increased lactic acid, and source of infection are gallbladder, pneumonia Added azithromycin  500 mg IV daily, to complete 5-day course for atypical pneumonia coverage Patient currently has MAP greater than 65, given IV fluids shortage, no indication for fluid bolus at this time Will add additional LR 1 L bolus on admission Continue with LR infusion at 150 mL/h, 20 hours ordered  Acute cholecystitis With abdominal pain and discomfort and distention Blood cultures x 2, urine culture are in process Continue with Zosyn  and vancomycin  per pharmacy EDP consulted general surgery who recommends IR consultation EDP consulted IR and they will be coming in for urgent intra-abdominal stent placement Patient will be kept n.p.o. Symptomatic support: Morphine  2 mg IV every 4 hours as needed for moderate pain, 20 hours ordered; morphine  4 mg IV every 4 hours as needed for severe pain, 20 hours ordered  CAP (community acquired pneumonia) Continue with vancomycin  and Zosyn  Added azithromycin  for atypical coverage, for 5 days Incentive spirometry, flutter valve  Elevated CK Continue with aggressive fluid hydration Recheck CK in a.m.  Elevated LFTs Suspect transaminitis in setting of acute cholecystitis Recheck LFTs in the  a.m.  Hyperlipidemia Home atorvastatin  not resumed on admission in setting of elevated LFTs/transaminitis secondary to acute cholecystitis AM team to resume when the benefits outweigh the risk  Essential hypertension Amlodipine  10 mg daily not resumed on admission as patient is meeting criteria for severe sepsis; AM team to resume when the benefits outweigh the risk Hydralazine  5 mg IV every 6 hours as needed for SBP greater 175, 6 days ordered  CAD S/P percutaneous coronary angioplasty Status post DES PCI 2014 Patient is currently on Plavix , which has not been continued on admission AM team to resume home Plavix  when the benefits outweigh the risk  Hypokalemia Status post potassium chloride  per EDP, 10 mEq IV, 5 doses ordered on admission Potassium chloride  40 mEq p.o. ordered in the a.m. Recheck BMP in a.m.  Chart reviewed.   DVT prophylaxis: TED hose; AM team to initiate pharmacologic DVT prophylaxis when the benefits outweigh the risk Code Status: full code Diet: N.p.o. due to high aspiration risk Family Communication: no Disposition Plan: Pending clinical course; guarded prognosis Consults called: General Surgery and IR per EDP Admission status: PCU, inpatient  Past Medical History:  Diagnosis Date   Cancer (HCC)    melanoma   Chronic sinus bradycardia    Coronary artery disease    Diabetes mellitus without complication (HCC)    History of dysphagia    Hypercholesterolemia    Hypertension    Shortness of breath dyspnea    Thyroid  nodule    Past Surgical History:  Procedure Laterality Date   c-5 neck surgery     COLON SURGERY     COLONOSCOPY WITH ESOPHAGOGASTRODUODENOSCOPY (EGD)     COLONOSCOPY WITH PROPOFOL  N/A 07/12/2015   Procedure: COLONOSCOPY WITH PROPOFOL ;  Surgeon: Francisco RAYMOND Mariner, MD;  Location: [facility] ENDOSCOPY;  Service: Endoscopy;  Laterality: N/A;   CORONARY ANGIOPLASTY WITH STENT PLACEMENT     ESOPHAGOGASTRODUODENOSCOPY (EGD) WITH PROPOFOL  N/A  07/12/2015   Procedure: ESOPHAGOGASTRODUODENOSCOPY (EGD) WITH PROPOFOL ;  Surgeon: Francisco RAYMOND Mariner, MD;  Location: [facility] ENDOSCOPY;  Service: Endoscopy;  Laterality: N/A;   HERNIA REPAIR     Social History:  reports that he quit smoking about 30 years ago. His smoking use included cigarettes. He has never used smokeless tobacco. He reports that he does not drink alcohol and does not use drugs.  Allergies  Allergen Reactions   Enalapril Swelling    Other Reaction(s): Angioedema   Keflet [Cephalexin] Rash   Family History  Problem Relation Age of Onset   Cancer Brother    Coronary artery disease Brother    Family history: Family history reviewed and not pertinent.  Prior to Admission medications   Medication Sig Start Date End Date Taking? Authorizing Provider  amLODipine  (NORVASC ) 10 MG tablet Take 1 tablet (10 mg total) by mouth daily. 02/15/23   Bryn Bernardino NOVAK, MD  aspirin  EC 81 MG tablet Take 1 tablet (81 mg total) by mouth daily. Swallow whole. 02/15/23 05/16/23  Bryn Bernardino NOVAK, MD  atorvastatin  (LIPITOR) 40 MG tablet Take 1 tablet (40 mg total) by mouth daily. 02/15/23   Bryn Bernardino NOVAK, MD  clopidogrel  (PLAVIX ) 75 MG tablet Take 1 tablet (75 mg total) by mouth daily. 02/15/23   Bryn Bernardino NOVAK, MD   Physical Exam: Vitals:   03/09/23 1855 03/09/23 1900 03/09/23 1915 03/09/23 1930  BP: 116/68 (!) 147/76 138/68 137/66  Pulse: 79 (!) 59 77 72  Resp:  15 17 16 13   Temp:      TempSrc:      SpO2: 95% 99% 100% 97%   Constitutional: appears age-appropriate, frail, sleepy Eyes: PERRL, lids and conjunctivae normal ENMT: Mucous membranes are moist. Posterior pharynx clear of any exudate or lesions. Age-appropriate dentition. Hearing appropriate Neck: normal, supple, no masses, no thyromegaly Respiratory: clear to auscultation bilaterally, no wheezing, no crackles. Normal respiratory effort. No accessory muscle use.  Cardiovascular: Regular rate and rhythm, no murmurs / rubs / gallops. No  extremity edema. 2+ pedal pulses. No carotid bruits.  Abdomen: + tenderness and not distended, no masses palpated, no hepatosplenomegaly. Bowel sounds positive.  Percutaneous cholecystostomy drain in place in the right upper quadrant Musculoskeletal: no clubbing / cyanosis. No joint deformity upper and lower extremities. Good ROM, no contractures, no atrophy. Normal muscle tone.  Skin: no rashes, lesions, ulcers. No induration Neurologic: Sensation intact. Strength is appropriately weak Psychiatric: Unable to assess judgment and insight, alert and oriented as patient is seen postop recovery.  EKG: independently reviewed, showing sinus rhythm with rate of 86, QTc 426  Chest x-ray on Admission: I personally reviewed and I agree with radiologist reading as below.  CT ABDOMEN PELVIS W CONTRAST Result Date: 03/09/2023 CLINICAL DATA:  Fever and abdominal tendinous. EXAM: CT ABDOMEN AND PELVIS WITH CONTRAST TECHNIQUE: Multidetector CT imaging of the abdomen and pelvis was performed using the standard protocol following bolus administration of intravenous contrast. RADIATION DOSE REDUCTION: This exam was performed according to the departmental dose-optimization program which includes automated exposure control, adjustment of the mA and/or kV according to patient size and/or use of iterative reconstruction technique. CONTRAST:  OMNIPAQUE  IOHEXOL  300 MG/ML  SOLN COMPARISON:  CT abdomen pelvis dated February 24, 2018. FINDINGS: Lower chest: No acute abnormality.  Bibasilar atelectasis. Hepatobiliary: Gallbladder with mild irregular wall thickening and suspected wall discontinuity at the fundus. Small gallstone seen near the gallbladder neck. 2.8 x 3.4 cm irregular low-density lesion with subtle peripheral enhancement in the inferior liver adjacent to the gallbladder fundus (series 3, image 26). Pericholecystic inflammatory change near the gallbladder neck. No other focal liver abnormality. No biliary  dilatation. Pancreas: Unremarkable. No pancreatic ductal dilatation or surrounding inflammatory changes. Spleen: Normal in size without focal abnormality. Adrenals/Urinary Tract: 1 cm left adrenal nodule unchanged since 2020, consistent with benign adenoma. Insert follow-up normal right adrenal gland. No renal calculi or mass. Mild bilateral hydroureteronephrosis with markedly distended bladder. Stomach/Bowel: Stomach is within normal limits. Appendix appears normal. No evidence of bowel wall thickening, distention, or inflammatory changes. Diffuse colonic diverticulosis. Vascular/Lymphatic: Aortic atherosclerosis. No enlarged abdominal or pelvic lymph nodes. Reproductive: Mild prostatomegaly with TURP defect noted. Other: No abdominal wall hernia or abnormality. No abdominopelvic ascites. No pneumoperitoneum. Musculoskeletal: No acute or significant osseous findings. IMPRESSION: 1. Acute cholecystitis with probable gallbladder perforation at the fundus. 3.4 cm irregular low-density lesion with subtle peripheral enhancement in the inferior liver adjacent to the gallbladder fundus, concerning for developing hepatic abscess. 2. Mild bilateral hydroureteronephrosis with markedly distended bladder, consistent with bladder outlet obstruction 3.  Aortic Atherosclerosis (ICD10-I70.0). Electronically Signed   By: Elsie ONEIDA Shoulder M.D.   On: 03/09/2023 16:24   CT HEAD WO CONTRAST ( ) Result Date: 03/09/2023 CLINICAL DATA:  Recent severe. Evaluate for intracranial hemorrhage. EXAM: CT HEAD WITHOUT CONTRAST TECHNIQUE: Contiguous axial images were obtained from the base of the skull through the vertex without intravenous contrast. RADIATION DOSE REDUCTION: This exam was performed according to the departmental dose-optimization program which includes  automated exposure control, adjustment of the mA and/or kV according to patient size and/or use of iterative reconstruction technique. COMPARISON:  Head CT dated 02/11/2023.  FINDINGS: Brain: Moderate age-related atrophy and chronic microvascular ischemic changes. Old appearing small right periventricular lacunar infarct. There is no acute intracranial hemorrhage. No mass effect or midline shift. No extra-axial fluid collection. Vascular: No hyperdense vessel or unexpected calcification. Skull: Normal. Negative for fracture or focal lesion. Sinuses/Orbits: No acute finding. Other: None IMPRESSION: 1. No acute intracranial pathology. 2. Moderate age-related atrophy and chronic microvascular ischemic changes. Electronically Signed   By: Vanetta Chou M.D.   On: 03/09/2023 16:16   DG Chest 2 View Result Date: 03/09/2023 CLINICAL DATA:  Dyspnea EXAM: CHEST - 2 VIEW COMPARISON:  08/01/2010 chest radiograph. FINDINGS: Stable cardiomediastinal silhouette with mild cardiomegaly. No pneumothorax. No pleural effusion. No overt pulmonary edema. Streaky and patchy mild bibasilar lung opacities. IMPRESSION: 1. Mild cardiomegaly. No overt pulmonary edema. 2. Streaky and patchy mild bibasilar lung opacities, favor atelectasis, difficult to exclude a component of aspiration or pneumonia. Electronically Signed   By: Selinda DELENA Blue M.D.   On: 03/09/2023 15:02   Labs on Admission: I have personally reviewed following labs  CBC: Recent Labs  Lab 03/09/23 1414  WBC 14.7*  HGB 10.5*  HCT 32.6*  MCV 91.8  PLT 97*   Basic Metabolic Panel: Recent Labs  Lab 03/09/23 1414 03/09/23 1517  NA 142  --   K 2.2*  --   CL 106  --   CO2 25  --   GLUCOSE 150*  --   BUN 31*  --   CREATININE 0.98  --   CALCIUM  6.9*  --   MG  --  2.3   GFR: CrCl cannot be calculated (Unknown ideal weight.).  Liver Function Tests: Recent Labs  Lab 03/09/23 1517  AST 75*  ALT 53*  ALKPHOS 110  BILITOT 1.9*  PROT 7.0  ALBUMIN 3.1*   Recent Labs  Lab 03/09/23 1414  LIPASE 27   Cardiac Enzymes: Recent Labs  Lab 03/09/23 1517  CKTOTAL 1,525*   Urine analysis:    Component Value Date/Time    COLORURINE AMBER (A) 03/09/2023 1518   APPEARANCEUR HAZY (A) 03/09/2023 1518   LABSPEC 1.016 03/09/2023 1518   PHURINE 5.0 03/09/2023 1518   GLUCOSEU NEGATIVE 03/09/2023 1518   HGBUR MODERATE (A) 03/09/2023 1518   BILIRUBINUR NEGATIVE 03/09/2023 1518   KETONESUR NEGATIVE 03/09/2023 1518   PROTEINUR 30 (A) 03/09/2023 1518   NITRITE NEGATIVE 03/09/2023 1518   LEUKOCYTESUR TRACE (A) 03/09/2023 1518   This document was prepared using Dragon Voice Recognition software and may include unintentional dictation errors.  Dr. Sherre [facility]  If 7PM-7AM, please contact overnight-coverage provider If 7AM-7PM, please contact day attending provider [email or web address]  03/09/2023, 7:35 PM

## 2023-03-09 NOTE — ED Notes (Signed)
 Patient transported to CT

## 2023-03-09 NOTE — ED Notes (Signed)
 This Registered Nurse (RN) assumed responsibility for the care of the assigned patient at 1930 on 03/09/23 after patient returned from IR for special procedure to place drain for gallbladder perforation.Patient is now back in ED in assigned room with special procedure nurse completing her assessment for patient recovery period at bedside. At this time, all nursing tasks, documentation, and responsibilities prior to this time are not the responsibility of this RN. Any assessments, interventions, or documentation completed before this time are not within the scope of this RN's duties.  Effective 1930 on 03/09/23, this RN is now accountable for all aspects of patient care, including but not limited to assessments, interventions, documentation, medication administration, and care coordination. All future tasks and documentation will be managed and completed by this RN from this time until care handoff at 0700 on 03/10/23.

## 2023-03-09 NOTE — Assessment & Plan Note (Signed)
 Continue with vancomycin  and Zosyn  Added azithromycin  for atypical coverage, for 5 days Incentive spirometry, flutter valve

## 2023-03-09 NOTE — Assessment & Plan Note (Addendum)
 With abdominal pain and discomfort and distention Blood cultures x 2, urine culture are in process Continue with Zosyn  and vancomycin  per pharmacy EDP consulted general surgery who recommends IR consultation EDP consulted IR and they will be coming in for urgent intra-abdominal stent placement Patient will be kept n.p.o. Symptomatic support: Morphine  2 mg IV every 4 hours as needed for moderate pain, 20 hours ordered; morphine  4 mg IV every 4 hours as needed for severe pain, 20 hours ordered

## 2023-03-09 NOTE — ED Notes (Signed)
 Pt placed in fresh brief by Mansfield Seip, NT

## 2023-03-09 NOTE — Procedures (Signed)
 Interventional Radiology Procedure Note  Procedure: Placement of a 2F transperitoneal percutaneous cholecystostomy tube.  Complications: None  Estimated Blood Loss: None  Recommendations: - Drain to bag - Return to IR in 4-6 weeks for drain check   Signed,  Wilkie LOIS Lent, MD

## 2023-03-09 NOTE — Assessment & Plan Note (Addendum)
 Status post potassium chloride  per EDP, 10 mEq IV, 5 doses ordered on admission Potassium chloride  40 mEq p.o. ordered in the a.m. Recheck BMP in a.m.

## 2023-03-09 NOTE — Assessment & Plan Note (Addendum)
 Suspect transaminitis in setting of acute cholecystitis Recheck LFTs in the a.m.

## 2023-03-09 NOTE — ED Triage Notes (Addendum)
 First nurse note: Patient arrived by [facility] from home. C/o hard abdomen, fever, and AMS. Patient weak and unable to ambulate for EMS. Home health reports multiple falls over the last few days  Tested positive for flu a few days ago. EMS reports strong UTI smell  Just released from rehab for stroke.  EMS vitals: 157/73 b/p 102CBG 83% RA - 4L O2 97%

## 2023-03-09 NOTE — Progress Notes (Signed)
 Pharmacy Antibiotic Note  Francisco Reid is a 83 y.o. male admitted on 03/09/2023 with sepsis and intra-abdominal infection .  Pharmacy has been consulted for Zosyn  and Vancomycin  dosing.  Plan: Vancomycin  2000 mg IV Q 24 hrs. Goal AUC 400-550. Expected AUC: 509.9 SCr used: 0.98 Expected Cmin: 11.1  Zosyn  3.375g IV q8h extended infusion   Weight: 95.3 kg (210 lb 1.6 oz)  Temp (24hrs), Avg:98.4 F (36.9 C), Min:98.4 F (36.9 C), Max:98.4 F (36.9 C)  Recent Labs  Lab 03/09/23 1414 03/09/23 1433 03/09/23 1517  WBC 14.7*  --   --   CREATININE 0.98  --   --   LATICACIDVEN  --  2.6* 3.0*    Estimated Creatinine Clearance: 69.6 mL/min (by C-G formula based on SCr of 0.98 mg/dL).    Allergies  Allergen Reactions   Enalapril Swelling    Other Reaction(s): Angioedema   Keflet [Cephalexin] Rash    Antimicrobials this admission: Zosyn  2/8 >>  Vancomycin  2/8 >>   Dose adjustments this admission:   Microbiology results:   Thank you for allowing pharmacy to be a part of this patient's care.  Olam Fritter, PharmD, BCPS 03/09/2023 8:59 PM

## 2023-03-09 NOTE — ED Triage Notes (Addendum)
 Pt arouses to light physical touch- is currently AOX4, falls asleep during triage. Pt denies any pain at this time. Pt is weak upon transferring to recliner. Lung sounds w/ coarse crackles noted bilaterally, respirations even, tachypnic, unlabored. Pt's clothes are soiled and pt has strong urine/ammonia smell. Pt remains on 3 liter's [place] from EMS. Pt received about 500 ml's NaCl IV by EMS.

## 2023-03-09 NOTE — Assessment & Plan Note (Signed)
 Home atorvastatin  not resumed on admission in setting of elevated LFTs/transaminitis secondary to acute cholecystitis AM team to resume when the benefits outweigh the risk

## 2023-03-09 NOTE — ED Notes (Signed)
 Patient to IR

## 2023-03-09 NOTE — Assessment & Plan Note (Signed)
 Continue with aggressive fluid hydration Recheck CK in a.m.

## 2023-03-09 NOTE — Assessment & Plan Note (Signed)
 Status post DES PCI 2014 Patient is currently on Plavix , which has not been continued on admission AM team to resume home Plavix  when the benefits outweigh the risk

## 2023-03-09 NOTE — Consult Note (Signed)
 Chief Complaint: Patient was seen in consultation today for perforated/gangrenous cholecystitis at the request of Dr. Claudene  Referring Physician(s): Dr. Ester Claudene  Supervising Physician: Karalee Beat  Patient Status: [facility] - ED  History of Present Illness: Francisco Reid is a 83 y.o. male found down at home, altered.  He was brought to [facility] ED where workup confirms acute cholecystitis with disruption of the GB wall and early intra-hepatic extension c/w perforated/gangrenous cholecystitis.  No definite stones on CT.   Pt is awake but altered.  Nephew present and making decisions.   Past Medical History:  Diagnosis Date   Cancer (HCC)    melanoma   Chronic sinus bradycardia    Coronary artery disease    Diabetes mellitus without complication (HCC)    History of dysphagia    Hypercholesterolemia    Hypertension    Shortness of breath dyspnea    Thyroid  nodule     Past Surgical History:  Procedure Laterality Date   c-5 neck surgery     COLON SURGERY     COLONOSCOPY WITH ESOPHAGOGASTRODUODENOSCOPY (EGD)     COLONOSCOPY WITH PROPOFOL  N/A 07/12/2015   Procedure: COLONOSCOPY WITH PROPOFOL ;  Surgeon: Gladis RAYMOND Mariner, MD;  Location: [facility] ENDOSCOPY;  Service: Endoscopy;  Laterality: N/A;   CORONARY ANGIOPLASTY WITH STENT PLACEMENT     ESOPHAGOGASTRODUODENOSCOPY (EGD) WITH PROPOFOL  N/A 07/12/2015   Procedure: ESOPHAGOGASTRODUODENOSCOPY (EGD) WITH PROPOFOL ;  Surgeon: Gladis RAYMOND Mariner, MD;  Location: [facility] ENDOSCOPY;  Service: Endoscopy;  Laterality: N/A;   HERNIA REPAIR      Allergies: Enalapril and Keflet [cephalexin]  Medications: Prior to Admission medications   Medication Sig Start Date End Date Taking? Authorizing Provider  amLODipine  (NORVASC ) 10 MG tablet Take 1 tablet (10 mg total) by mouth daily. 02/15/23  Yes Bryn Bernardino NOVAK, MD  aspirin  EC 81 MG tablet Take 1 tablet (81 mg total) by mouth daily. Swallow whole. 02/15/23 05/16/23 Yes Bryn Bernardino NOVAK, MD   atorvastatin  (LIPITOR) 40 MG tablet Take 1 tablet (40 mg total) by mouth daily. 02/15/23  Yes Bryn Bernardino NOVAK, MD  clopidogrel  (PLAVIX ) 75 MG tablet Take 1 tablet (75 mg total) by mouth daily. 02/15/23  Yes Bryn Bernardino NOVAK, MD     Family History  Problem Relation Age of Onset   Cancer Brother    Coronary artery disease Brother     Social History   Socioeconomic History   Marital status: Single    Spouse name: Not on file   Number of children: Not on file   Years of education: Not on file   Highest education level: Not on file  Occupational History   Not on file  Tobacco Use   Smoking status: Former    Current packs/day: 0.00    Types: Cigarettes    Quit date: 09/01/1992    Years since quitting: 30.5   Smokeless tobacco: Never  Substance and Sexual Activity   Alcohol use: No   Drug use: No   Sexual activity: Not Currently  Other Topics Concern   Not on file  Social History Narrative   Not on file   Social Drivers of Health   Financial Resource Strain: Not on file  Food Insecurity: No Food Insecurity (02/11/2023)   Hunger Vital Sign    Worried About Running Out of Food in the Last Year: Never true    Ran Out of Food in the Last Year: Never true  Transportation Needs: No Transportation Needs (02/11/2023)   PRAPARE -  Administrator, Civil Service (Medical): No    Lack of Transportation (Non-Medical): No  Physical Activity: Not on file  Stress: Not on file  Social Connections: Patient Declined (02/11/2023)   Social Connection and Isolation Panel [NHANES]    Frequency of Communication with Friends and Family: Patient declined    Frequency of Social Gatherings with Friends and Family: Patient declined    Attends Religious Services: Patient declined    Database Administrator or Organizations: Patient declined    Attends Banker Meetings: Patient declined    Marital Status: Patient declined    Review of Systems: A 12 point ROS discussed and pertinent  positives are indicated in the HPI above.  All other systems are negative.  Review of Systems  Vital Signs: BP (!) 147/76   Pulse (!) 59   Temp 98.4 F (36.9 C) (Oral)   Resp 17   SpO2 99%   Advance Care Plan: The advanced care plan/surrogate decision maker was discussed at the time of visit and documented in the medical record.    Physical Exam  Imaging: CT ABDOMEN PELVIS W CONTRAST Result Date: 03/09/2023 CLINICAL DATA:  Fever and abdominal tendinous. EXAM: CT ABDOMEN AND PELVIS WITH CONTRAST TECHNIQUE: Multidetector CT imaging of the abdomen and pelvis was performed using the standard protocol following bolus administration of intravenous contrast. RADIATION DOSE REDUCTION: This exam was performed according to the departmental dose-optimization program which includes automated exposure control, adjustment of the mA and/or kV according to patient size and/or use of iterative reconstruction technique. CONTRAST:  OMNIPAQUE  IOHEXOL  300 MG/ML  SOLN COMPARISON:  CT abdomen pelvis dated February 24, 2018. FINDINGS: Lower chest: No acute abnormality.  Bibasilar atelectasis. Hepatobiliary: Gallbladder with mild irregular wall thickening and suspected wall discontinuity at the fundus. Small gallstone seen near the gallbladder neck. 2.8 x 3.4 cm irregular low-density lesion with subtle peripheral enhancement in the inferior liver adjacent to the gallbladder fundus (series 3, image 26). Pericholecystic inflammatory change near the gallbladder neck. No other focal liver abnormality. No biliary dilatation. Pancreas: Unremarkable. No pancreatic ductal dilatation or surrounding inflammatory changes. Spleen: Normal in size without focal abnormality. Adrenals/Urinary Tract: 1 cm left adrenal nodule unchanged since 2020, consistent with benign adenoma. Insert follow-up normal right adrenal gland. No renal calculi or mass. Mild bilateral hydroureteronephrosis with markedly distended bladder. Stomach/Bowel:  Stomach is within normal limits. Appendix appears normal. No evidence of bowel wall thickening, distention, or inflammatory changes. Diffuse colonic diverticulosis. Vascular/Lymphatic: Aortic atherosclerosis. No enlarged abdominal or pelvic lymph nodes. Reproductive: Mild prostatomegaly with TURP defect noted. Other: No abdominal wall hernia or abnormality. No abdominopelvic ascites. No pneumoperitoneum. Musculoskeletal: No acute or significant osseous findings. IMPRESSION: 1. Acute cholecystitis with probable gallbladder perforation at the fundus. 3.4 cm irregular low-density lesion with subtle peripheral enhancement in the inferior liver adjacent to the gallbladder fundus, concerning for developing hepatic abscess. 2. Mild bilateral hydroureteronephrosis with markedly distended bladder, consistent with bladder outlet obstruction 3.  Aortic Atherosclerosis (ICD10-I70.0). Electronically Signed   By: Elsie ONEIDA Shoulder M.D.   On: 03/09/2023 16:24   CT HEAD WO CONTRAST ( ) Result Date: 03/09/2023 CLINICAL DATA:  Recent severe. Evaluate for intracranial hemorrhage. EXAM: CT HEAD WITHOUT CONTRAST TECHNIQUE: Contiguous axial images were obtained from the base of the skull through the vertex without intravenous contrast. RADIATION DOSE REDUCTION: This exam was performed according to the departmental dose-optimization program which includes automated exposure control, adjustment of the mA and/or kV according to patient size  and/or use of iterative reconstruction technique. COMPARISON:  Head CT dated 02/11/2023. FINDINGS: Brain: Moderate age-related atrophy and chronic microvascular ischemic changes. Old appearing small right periventricular lacunar infarct. There is no acute intracranial hemorrhage. No mass effect or midline shift. No extra-axial fluid collection. Vascular: No hyperdense vessel or unexpected calcification. Skull: Normal. Negative for fracture or focal lesion. Sinuses/Orbits: No acute finding. Other: None  IMPRESSION: 1. No acute intracranial pathology. 2. Moderate age-related atrophy and chronic microvascular ischemic changes. Electronically Signed   By: Vanetta Chou M.D.   On: 03/09/2023 16:16   DG Chest 2 View Result Date: 03/09/2023 CLINICAL DATA:  Dyspnea EXAM: CHEST - 2 VIEW COMPARISON:  08/01/2010 chest radiograph. FINDINGS: Stable cardiomediastinal silhouette with mild cardiomegaly. No pneumothorax. No pleural effusion. No overt pulmonary edema. Streaky and patchy mild bibasilar lung opacities. IMPRESSION: 1. Mild cardiomegaly. No overt pulmonary edema. 2. Streaky and patchy mild bibasilar lung opacities, favor atelectasis, difficult to exclude a component of aspiration or pneumonia. Electronically Signed   By: Selinda DELENA Blue M.D.   On: 03/09/2023 15:02   ECHOCARDIOGRAM COMPLETE Result Date: 02/12/2023    ECHOCARDIOGRAM REPORT   Patient Name:   JAKUB DEBOLD Date of Exam: 02/12/2023 Medical Rec #:  [id number]      Height:       72.0 in Accession #:    [id number]     Weight:       210.1 lb Date of Birth:  1940-02-27      BSA:          2.176 m Patient Age:    82 years       BP:           141/62 mmHg Patient Gender: M              HR:           60 bpm. Exam Location:  [facility] Procedure: 2D Echo, Color Doppler and Cardiac Doppler Indications:   Stroke I63.9  History:       Patient has no prior history of Echocardiogram examinations.                Stroke.  Sonographer:   Tinnie Gosling RDCS Referring      [id number] MARIEN LITTIE PIETY Phys: IMPRESSIONS  1. Left ventricular ejection fraction, by estimation, is 45 to 50%. The left ventricle has mildly decreased function. The left ventricle demonstrates global hypokinesis. The left ventricular internal cavity size was mildly dilated. There is mild concentric left ventricular hypertrophy. Left ventricular diastolic parameters are consistent with Grade I diastolic dysfunction (impaired relaxation).  2. Right ventricular systolic function is low normal. The right  ventricular size is normal.  3. The mitral valve is grossly normal. Trivial mitral valve regurgitation.  4. The aortic valve was not well visualized. There is mild calcification of the aortic valve. Aortic valve regurgitation is not visualized.  5. Unable to estimate CVP. Comparison(s): Prior images unable to be directly viewed, comparison made by report only. LVEF reported to be 45% in 2017 suggesting relative stability with current study. FINDINGS  Left Ventricle: Left ventricular ejection fraction, by estimation, is 45 to 50%. The left ventricle has mildly decreased function. The left ventricle demonstrates global hypokinesis. The left ventricular internal cavity size was mildly dilated. There is  mild concentric left ventricular hypertrophy. Left ventricular diastolic parameters are consistent with Grade I diastolic dysfunction (impaired relaxation). Right Ventricle: The right ventricular size is normal. No increase in right ventricular wall  thickness. Right ventricular systolic function is low normal. Left Atrium: Left atrial size was normal in size. Right Atrium: Right atrial size was normal in size. Pericardium: There is no evidence of pericardial effusion. Presence of epicardial fat layer. Mitral Valve: The mitral valve is grossly normal. Trivial mitral valve regurgitation. Tricuspid Valve: The tricuspid valve is grossly normal. Tricuspid valve regurgitation is trivial. Aortic Valve: The aortic valve was not well visualized. There is mild calcification of the aortic valve. There is mild aortic valve annular calcification. Aortic valve regurgitation is not visualized. Pulmonic Valve: The pulmonic valve was not well visualized. Pulmonic valve regurgitation is not visualized. Aorta: The aortic root is normal in size and structure. Venous: Unable to estimate CVP. The inferior vena cava was not well visualized. IAS/Shunts: No atrial level shunt detected by color flow Doppler.  LEFT VENTRICLE PLAX 2D LVIDd:          6.10 cm   Diastology LVIDs:         5.10 cm   LV e' medial:    2.72 cm/s LV PW:         1.20 cm   LV E/e' medial:  13.7 LV IVS:        1.30 cm   LV e' lateral:   5.44 cm/s LVOT diam:     2.30 cm   LV E/e' lateral: 6.9 LV SV:         60 LV SV Index:   27 LVOT Area:     4.15 cm  RIGHT VENTRICLE RV S prime:     9.79 cm/s TAPSE (M-mode): 1.3 cm LEFT ATRIUM             Index LA diam:        4.30 cm 1.98 cm/m LA Vol (A2C):   42.6 ml 19.58 ml/m LA Vol (A4C):   52.7 ml 24.22 ml/m LA Biplane Vol: 52.5 ml 24.13 ml/m  AORTIC VALVE LVOT Vmax:   64.00 cm/s LVOT Vmean:  47.100 cm/s LVOT VTI:    0.144 m  AORTA Ao Root diam: 3.50 cm MITRAL VALVE MV Area (PHT): 3.66 cm    SHUNTS MV Decel Time: 207 msec    Systemic VTI:  0.14 m MV E velocity: 37.30 cm/s  Systemic Diam: 2.30 cm MV A velocity: 92.50 cm/s MV E/A ratio:  0.40 Jayson Sierras MD Electronically signed by Jayson Sierras MD Signature Date/Time: 02/12/2023/3:57:13 PM    Final    MR BRAIN WO CONTRAST Result Date: 02/11/2023 CLINICAL DATA:  Stroke suspected EXAM: MRI HEAD WITHOUT CONTRAST TECHNIQUE: Multiplanar, multiecho pulse sequences of the brain and surrounding structures were obtained without intravenous contrast. COMPARISON:  02/11/2023 CT head and CTA head and neck, no prior MRI available FINDINGS: Brain: Restricted diffusion with ADC correlates in the left frontal and parietal cortex (series 5, images 17, 27-33), right posterior basal ganglia/corona radiata (series 5, images 23-24), left posterior caudate (series 5, image 20), left occipital lobe (series 5, images 17-20), compatible with acute to early subacute infarcts. No acute hemorrhage, mass, mass effect, or midline shift. No hydrocephalus or extra-axial collection. Pituitary and craniocervical junction within normal limits. Hemosiderin deposition is associated with some of the suspected acute infarcts (series 13, image 50 and 54), which may indicate petechial hemorrhage. 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 Advanced cerebral volume loss for age, most prominent in left greater than right frontal parietal lobes. Confluent  T2 hyperintense signal in the periventricular white matter, likely the sequela of severe chronic small vessel ischemic disease. Vascular: Loss of the left vertebral artery flow void (series 10, image 1), which correlates with the occlusion seen on the same-day CTA. Otherwise normal arterial flow voids. Skull and upper cervical spine: Normal marrow signal. Sinuses/Orbits: Clear paranasal sinuses. No acute finding in the orbits. Right lens replacement. Other: The mastoid air cells are well aerated. IMPRESSION: 1. Acute to early subacute infarcts in the left frontal and parietal cortex, right posterior basal ganglia/corona radiata, left posterior caudate, and left occipital lobe. Hemosiderin deposition is associated with some of these infarcts, which may indicate petechial hemorrhage. 2. Loss of the left vertebral artery flow void, which correlates with the occlusion seen on the same-day CTA These results were called by telephone at the time of interpretation on 02/11/2023 at 6:19 pm to provider STACK, who verbally acknowledged these results. Electronically Signed   By: Donald Campion M.D.   On: 02/11/2023 18:20   CT ANGIO HEAD NECK W WO CM W PERF (CODE STROKE) Result Date: 02/11/2023 CLINICAL DATA:  Neuro deficit, acute, stroke suspected RUE flaccid paralysis EXAM: CT ANGIOGRAPHY HEAD AND NECK CT PERFUSION BRAIN TECHNIQUE: Multidetector CT imaging of the head and neck was performed using the standard protocol during bolus administration of intravenous contrast. Multiplanar CT image reconstructions and MIPs were obtained to evaluate the vascular anatomy. Carotid stenosis measurements (when applicable) are obtained utilizing NASCET criteria, using the distal internal carotid diameter as  the denominator. Multiphase CT imaging of the brain was performed following IV bolus contrast injection. Subsequent parametric perfusion maps were calculated using RAPID software. RADIATION DOSE REDUCTION: This exam was performed according to the departmental dose-optimization program which includes automated exposure control, adjustment of the mA and/or kV according to patient size and/or use of iterative reconstruction technique. CONTRAST:  OMNIPAQUE  IOHEXOL  350 MG/ML SOLN COMPARISON:  None Available. FINDINGS: CTA NECK FINDINGS Aortic arch: Aortic atherosclerosis. Great vessel origins are patent without significant stenosis. Right carotid system: Atherosclerosis at the carotid bifurcation and involving the proximal ICA with proximally 50% stenosis of the proximal ICA. Left carotid system: Atherosclerosis at the carotid bifurcation and involving the proximal ICA without greater than 50% stenosis. Vertebral arteries: Right dominant. No evidence of dissection, stenosis (50% or greater) or occlusion in the neck. Skeleton: Other neck: Upper chest: Review of the MIP images confirms the above findings CTA HEAD FINDINGS Anterior circulation: Bilateral intracranial ICAs are patent with moderate left and mild right cavernous ICA narrowing. Bilateral MCAs and ACAs are patent without proximal hemodynamically significant stenosis. 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 Right intradural vertebral artery is patent with mild stenosis at its dural margin. Basilar artery and bilateral posterior cerebral arteries are patent without proximal hemodynamically significant stenosis. Venous sinuses: As permitted by contrast timing, patent. Review of the MIP images confirms the above findings CT Brain Perfusion Findings: ASPECTS: 9 CBF (<30%) Volume: 0mL Perfusion (Tmax>6.0s) volume: 0mL Mismatch Volume: 0mL Infarction Location:None identified. IMPRESSION: CTA: 1. 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 2. 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 3. Approximately 50% stenosis of the proximal right ICA. 4.  Aortic Atherosclerosis (ICD10-I70.0). CT perfusion: No evidence of core infarct or penumbra. Preliminary results were communicated on 02/11/2023 at 2:26 pm to provider Dr. Matthews via secure text paging. Electronically Signed   By: Gilmore GORMAN Molt M.D.   On: 02/11/2023 14:27   CT  HEAD CODE STROKE WO CONTRAST Result Date: 02/11/2023 CLINICAL DATA:  Code stroke. Provided history: Neuro deficit, acute, stroke suspected. EXAM: CT HEAD WITHOUT CONTRAST TECHNIQUE: Contiguous axial images were obtained from the base of the skull through the vertex without intravenous contrast. RADIATION DOSE REDUCTION: This exam was performed according to the departmental dose-optimization program which includes automated exposure control, adjustment of the mA and/or kV according to patient size and/or use of iterative reconstruction technique. COMPARISON:  None. FINDINGS: Brain: Generalized parenchymal atrophy. Commensurate prominence of the ventricles and sulci Age-indeterminate lacunar infarcts within the left centrum semiovale, left caudate nucleus and bilateral corona radiata. Moderate patchy and ill-defined hypoattenuation within the cerebral white matter, nonspecific but compatible with chronic small vessel ischemic disease. There is no acute intracranial hemorrhage. No demarcated cortical infarct. No extra-axial fluid collection. No evidence of an intracranial mass. No midline shift. Vascular: No hyperdense vessel.  Atherosclerotic calcifications. Skull: No calvarial fracture or aggressive osseous lesion. Sinuses/Orbits: No mass or acute finding within the imaged orbits. No significant paranasal sinus disease. ASPECTS (Alberta Stroke Program Early CT Score) - Ganglionic level infarction (caudate, lentiform nuclei, internal capsule, insula, M1-M3 cortex): 6  - Supraganglionic infarction (M4-M6 cortex): 3 Total score (0-10 with 10 being normal): 9 These results were called by telephone at the time of interpretation on 02/11/2023 at 1:52 pm to provider BERNARDINO FIREMAN , who verbally acknowledged these results. IMPRESSION: 1. Age-indeterminate lacunar infarcts within the left centrum semiovale, left caudate nucleus and bilateral corona radiata. ASPECTS is 9. 2. Background parenchymal atrophy and chronic small vessel ischemic disease. Electronically Signed   By: Rockey Childs D.O.   On: 02/11/2023 13:54    Labs:  CBC: Recent Labs    02/11/23 1340 02/11/23 1347 03/09/23 1414  WBC 8.0  --  14.7*  HGB 16.4 16.3 10.5*  HCT 48.2 48.0 32.6*  PLT 129*  --  97*    COAGS: Recent Labs    02/11/23 1340  INR 1.1  APTT 30    BMP: Recent Labs    02/11/23 1340 02/11/23 1347 03/09/23 1414  NA 136 141 142  K 3.8 3.9 2.2*  CL 103 102 106  CO2 26  --  25  GLUCOSE 108* 105* 150*  BUN 14 14 31*  CALCIUM  8.9  --  6.9*  CREATININE 1.02 1.10 0.98  GFRNONAA >60  --  >60    LIVER FUNCTION TESTS: Recent Labs    02/11/23 1340 03/09/23 1517  BILITOT 0.8 1.9*  AST 21 75*  ALT 17 53*  ALKPHOS 104 110  PROT 6.8 7.0  ALBUMIN 3.9 3.1*    TUMOR MARKERS: No results for input(s): AFPTM, CEA, CA199, CHROMGRNA in the last 8760 hours.  Assessment and Plan:  Acute gangrenous cholecystitis with sepsis.  Patient requires emergent percutaneous cholecystostomy tube placement.  He is at elevated risk of bleeding given DAPT, however the risk of bleeding is less than the risk of death from sepsis.   Risks, benefits and alternatives discussed with patient and his nephew.  They understand and desire to proceed.   Thank you for this interesting consult.  I greatly enjoyed meeting Francisco Reid and look forward to participating in their care.  A copy of this report was sent to the requesting provider on this date.  Electronically Signed: Wilkie MARLA Lent,  MD 03/09/2023, 7:02 PM   I spent a total of 20 Minutes   in face to face in clinical consultation, greater than 50% of which was  counseling/coordinating care for acute gangrenous cholecystitis

## 2023-03-09 NOTE — Hospital Course (Addendum)
 [name] is an 83 year old male with history of recent left lacunar infarct, history of CAD status post DES in 2014 currently on Plavix , chronic sinus bradycardia, history of dysphagia status post dilatation per [facility], gastritis, hypercholesterolemia, hypertension, thyroid  nodule, who presents to the emergency department for chief concerns of abdominal pain, fever, altered mental status.  Patient has been too weak and unable to ambulate for EMS.  WBC 14.7, hemoglobin 10.5, platelets of 97. BNP was elevated 183.5.  CK elevated at 1525.-C troponin is 60.  Procalcitonin 6.08.  Lactic acid elevated at 2.6 and on repeat was 3.0.  CT abdomen/pelvis showed acute cholecystitis with perforation, 3 cm liver abscess. Patient was seen by general surgery, has cholecystostomy performed by IR.  Antibiotic continued on Zosyn .  Culture from cholecystostomy showed gram-negative rods.  Blood cultures so far no growth.

## 2023-03-09 NOTE — Consult Note (Signed)
 Date of Consultation:  03/09/2023  Requesting Physician:  Ester Sharps, MD  Reason for Consultation:  Perforated cholecystitis  History of Present Illness: Francisco Reid is a 83 y.o. male presenting to the hospital with generalized weakness and fall at home.  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  He is now on Aspirin  and Plavix .  He was discharged on 02/14/23.  He reports feeling fatigued, having nausea, and abdominal pain in the lower abdomen and in the RUQ.  In the ED, his workup showed significant hypokalemia of 2.2 , elevated LFTs with total bili 1.9, AST 75, ALT 53, elevated CK 1525, lactic acidosis of 3, WBC of 14.7, Hgb of 10.5, platelet 97.  He had a CT of abdomen and pelvis which showed a very distended bladder with bilateral hydronephrosis, and distended and inflamed gallbladder with concern for perforation of the fundus resulting in a forming hepatic abscess.   Foley catheter was inserted with 2+ L urine output.  Past Medical History: Past Medical History:  Diagnosis Date   Cancer (HCC)    melanoma   Chronic sinus bradycardia    Coronary artery disease    Diabetes mellitus without complication (HCC)    History of dysphagia    Hypercholesterolemia    Hypertension    Shortness of breath dyspnea    Thyroid  nodule      Past Surgical History: Past Surgical History:  Procedure Laterality Date   c-5 neck surgery     COLON SURGERY     COLONOSCOPY WITH ESOPHAGOGASTRODUODENOSCOPY (EGD)     COLONOSCOPY WITH PROPOFOL  N/A 07/12/2015   Procedure: COLONOSCOPY WITH PROPOFOL ;  Surgeon: Gladis RAYMOND Mariner, MD;  Location: [facility] ENDOSCOPY;  Service: Endoscopy;  Laterality: N/A;   CORONARY ANGIOPLASTY WITH STENT PLACEMENT     ESOPHAGOGASTRODUODENOSCOPY (EGD) WITH PROPOFOL  N/A 07/12/2015   Procedure: ESOPHAGOGASTRODUODENOSCOPY (EGD)  WITH PROPOFOL ;  Surgeon: Gladis RAYMOND Mariner, MD;  Location: [facility] ENDOSCOPY;  Service: Endoscopy;  Laterality: N/A;   HERNIA REPAIR      Home Medications: Prior to Admission medications   Medication Sig Start Date End Date Taking? Authorizing Provider  amLODipine  (NORVASC ) 10 MG tablet Take 1 tablet (10 mg total) by mouth daily. 02/15/23  Yes Bryn Bernardino NOVAK, MD  aspirin  EC 81 MG tablet Take 1 tablet (81 mg total) by mouth daily. Swallow whole. 02/15/23 05/16/23 Yes Bryn Bernardino NOVAK, MD  atorvastatin  (LIPITOR) 40 MG tablet Take 1 tablet (40 mg total) by mouth daily. 02/15/23  Yes Bryn Bernardino NOVAK, MD  clopidogrel  (PLAVIX ) 75 MG tablet Take 1 tablet (75 mg total) by mouth daily. 02/15/23  Yes Bryn Bernardino NOVAK, MD    Allergies: Allergies  Allergen Reactions   Enalapril Swelling    Other Reaction(s): Angioedema   Keflet [Cephalexin] Rash    Social History:  reports that he quit smoking about 30 years ago. His smoking use included cigarettes. He has never used smokeless tobacco. He reports that he does not drink alcohol and does not use drugs.   Family History: Family History  Problem Relation Age of Onset   Cancer Brother    Coronary artery disease Brother     Review of Systems: Review of Systems  Constitutional:  Positive for malaise/fatigue. Negative for chills and fever.  HENT:  Negative for hearing loss.   Respiratory:  Negative for shortness of breath.  Cardiovascular:  Negative for chest pain.  Gastrointestinal:  Positive for abdominal pain and nausea. Negative for vomiting.  Genitourinary:  Negative for dysuria.  Musculoskeletal:  Negative for myalgias.  Skin:  Negative for rash.  Neurological:  Negative for dizziness.  Psychiatric/Behavioral:  Negative for depression.     Physical Exam BP 119/65   Pulse 81   Temp 98.4 F (36.9 C) (Oral)   Resp 17   SpO2 96%  CONSTITUTIONAL: Appears frail/fatigued. HEENT:  Normocephalic, atraumatic, extraocular motion intact. NECK: Trachea is  midline, and there is no jugular venous distension. RESPIRATORY:  Normal respiratory effort without pathologic use of accessory muscles. CARDIOVASCULAR: Regular rhythm and rate. GI: The abdomen is soft, non-distened, with mild RUQ and pelvic tenderness. Non-peritoneal. MUSCULOSKELETAL:  No peripheral edema or cyanosis. SKIN: No jaundice PSYCH:  Alert and oriented to person, place and time. Affect is normal.  Laboratory Analysis: Results for orders placed or performed during the hospital encounter of 03/09/23 (from the past 24 hours)  Basic metabolic panel     Status: Abnormal   Collection Time: 03/09/23  2:14 PM  Result Value Ref Range   Sodium 142 135 - 145 mmol/L   Potassium 2.2 (LL) 3.5 - 5.1 mmol/L   Chloride 106 98 - 111 mmol/L   CO2 25 22 - 32 mmol/L   Glucose, Bld 150 (H) 70 - 99 mg/dL   BUN 31 (H) 8 - 23 mg/dL   Creatinine, Ser 9.01 0.61 - 1.24 mg/dL   Calcium  6.9 (L) 8.9 - 10.3 mg/dL   GFR, Estimated >39 >39 mL/min   Anion gap 11 5 - 15  CBC     Status: Abnormal   Collection Time: 03/09/23  2:14 PM  Result Value Ref Range   WBC 14.7 (H) 4.0 - 10.5 K/uL   RBC 3.55 (L) 4.22 - 5.81 MIL/uL   Hemoglobin 10.5 (L) 13.0 - 17.0 g/dL   HCT 67.3 (L) 60.9 - 47.9 %   MCV 91.8 80.0 - 100.0 fL   MCH 29.6 26.0 - 34.0 pg   MCHC 32.2 30.0 - 36.0 g/dL   RDW 86.3 88.4 - 84.4 %   Platelets 97 (L) 150 - 400 K/uL   nRBC 0.0 0.0 - 0.2 %  Troponin I (High Sensitivity)     Status: Abnormal   Collection Time: 03/09/23  2:14 PM  Result Value Ref Range   Troponin I (High Sensitivity) 51 (H) <18 ng/L  Lipase, blood     Status: None   Collection Time: 03/09/23  2:14 PM  Result Value Ref Range   Lipase 27 11 - 51 U/L  Lactic acid, plasma     Status: Abnormal   Collection Time: 03/09/23  2:33 PM  Result Value Ref Range   Lactic Acid, Venous 2.6 (HH) 0.5 - 1.9 mmol/L  Lactic acid, plasma     Status: Abnormal   Collection Time: 03/09/23  3:17 PM  Result Value Ref Range   Lactic Acid, Venous  3.0 (HH) 0.5 - 1.9 mmol/L  Ethanol     Status: None   Collection Time: 03/09/23  3:17 PM  Result Value Ref Range   Alcohol, Ethyl (B) <10 <10 mg/dL  Procalcitonin     Status: None   Collection Time: 03/09/23  3:17 PM  Result Value Ref Range   Procalcitonin 6.08 ng/mL  Hepatic function panel     Status: Abnormal   Collection Time: 03/09/23  3:17 PM  Result Value Ref Range   Total Protein  7.0 6.5 - 8.1 g/dL   Albumin 3.1 (L) 3.5 - 5.0 g/dL   AST 75 (H) 15 - 41 U/L   ALT 53 (H) 0 - 44 U/L   Alkaline Phosphatase 110 38 - 126 U/L   Total Bilirubin 1.9 (H) 0.0 - 1.2 mg/dL   Bilirubin, Direct 0.8 (H) 0.0 - 0.2 mg/dL   Indirect Bilirubin 1.1 (H) 0.3 - 0.9 mg/dL  Resp panel by RT-PCR (RSV, Flu A&B, Covid) Anterior Nasal Swab     Status: None   Collection Time: 03/09/23  3:17 PM   Specimen: Anterior Nasal Swab  Result Value Ref Range   SARS Coronavirus 2 by RT PCR NEGATIVE NEGATIVE   Influenza A by PCR NEGATIVE NEGATIVE   Influenza B by PCR NEGATIVE NEGATIVE   Resp Syncytial Virus by PCR NEGATIVE NEGATIVE  CK     Status: Abnormal   Collection Time: 03/09/23  3:17 PM  Result Value Ref Range   Total CK 1,525 (H) 49 - 397 U/L  Magnesium     Status: None   Collection Time: 03/09/23  3:17 PM  Result Value Ref Range   Magnesium 2.3 1.7 - 2.4 mg/dL  Brain natriuretic peptide     Status: Abnormal   Collection Time: 03/09/23  3:17 PM  Result Value Ref Range   B Natriuretic Peptide 183.5 (H) 0.0 - 100.0 pg/mL  Troponin I (High Sensitivity)     Status: Abnormal   Collection Time: 03/09/23  3:17 PM  Result Value Ref Range   Troponin I (High Sensitivity) 60 (H) <18 ng/L  Urinalysis, Routine w reflex microscopic -Urine, Clean Catch     Status: Abnormal   Collection Time: 03/09/23  3:18 PM  Result Value Ref Range   Color, Urine AMBER (A) YELLOW   APPearance HAZY (A) CLEAR   Specific Gravity, Urine 1.016 1.005 - 1.030   pH 5.0 5.0 - 8.0   Glucose, UA NEGATIVE NEGATIVE mg/dL   Hgb urine  dipstick MODERATE (A) NEGATIVE   Bilirubin Urine NEGATIVE NEGATIVE   Ketones, ur NEGATIVE NEGATIVE mg/dL   Protein, ur 30 (A) NEGATIVE mg/dL   Nitrite NEGATIVE NEGATIVE   Leukocytes,Ua TRACE (A) NEGATIVE   RBC / HPF 11-20 0 - 5 RBC/hpf   WBC, UA 11-20 0 - 5 WBC/hpf   Bacteria, UA RARE (A) NONE SEEN   Squamous Epithelial / HPF 0-5 0 - 5 /HPF   Mucus PRESENT    Hyaline Casts, UA PRESENT     Imaging: CT ABDOMEN PELVIS W CONTRAST Result Date: 03/09/2023 CLINICAL DATA:  Fever and abdominal tendinous. EXAM: CT ABDOMEN AND PELVIS WITH CONTRAST TECHNIQUE: Multidetector CT imaging of the abdomen and pelvis was performed using the standard protocol following bolus administration of intravenous contrast. RADIATION DOSE REDUCTION: This exam was performed according to the departmental dose-optimization program which includes automated exposure control, adjustment of the mA and/or kV according to patient size and/or use of iterative reconstruction technique. CONTRAST:  OMNIPAQUE  IOHEXOL  300 MG/ML  SOLN COMPARISON:  CT abdomen pelvis dated February 24, 2018. FINDINGS: Lower chest: No acute abnormality.  Bibasilar atelectasis. Hepatobiliary: Gallbladder with mild irregular wall thickening and suspected wall discontinuity at the fundus. Small gallstone seen near the gallbladder neck. 2.8 x 3.4 cm irregular low-density lesion with subtle peripheral enhancement in the inferior liver adjacent to the gallbladder fundus (series 3, image 26). Pericholecystic inflammatory change near the gallbladder neck. No other focal liver abnormality. No biliary dilatation. Pancreas: Unremarkable. No pancreatic ductal dilatation  or surrounding inflammatory changes. Spleen: Normal in size without focal abnormality. Adrenals/Urinary Tract: 1 cm left adrenal nodule unchanged since 2020, consistent with benign adenoma. Insert follow-up normal right adrenal gland. No renal calculi or mass. Mild bilateral hydroureteronephrosis with  markedly distended bladder. Stomach/Bowel: Stomach is within normal limits. Appendix appears normal. No evidence of bowel wall thickening, distention, or inflammatory changes. Diffuse colonic diverticulosis. Vascular/Lymphatic: Aortic atherosclerosis. No enlarged abdominal or pelvic lymph nodes. Reproductive: Mild prostatomegaly with TURP defect noted. Other: No abdominal wall hernia or abnormality. No abdominopelvic ascites. No pneumoperitoneum. Musculoskeletal: No acute or significant osseous findings. IMPRESSION: 1. Acute cholecystitis with probable gallbladder perforation at the fundus. 3.4 cm irregular low-density lesion with subtle peripheral enhancement in the inferior liver adjacent to the gallbladder fundus, concerning for developing hepatic abscess. 2. Mild bilateral hydroureteronephrosis with markedly distended bladder, consistent with bladder outlet obstruction 3.  Aortic Atherosclerosis (ICD10-I70.0). Electronically Signed   By: Elsie ONEIDA Shoulder M.D.   On: 03/09/2023 16:24   CT HEAD WO CONTRAST ( ) Result Date: 03/09/2023 CLINICAL DATA:  Recent severe. Evaluate for intracranial hemorrhage. EXAM: CT HEAD WITHOUT CONTRAST TECHNIQUE: Contiguous axial images were obtained from the base of the skull through the vertex without intravenous contrast. RADIATION DOSE REDUCTION: This exam was performed according to the departmental dose-optimization program which includes automated exposure control, adjustment of the mA and/or kV according to patient size and/or use of iterative reconstruction technique. COMPARISON:  Head CT dated 02/11/2023. FINDINGS: Brain: Moderate age-related atrophy and chronic microvascular ischemic changes. Old appearing small right periventricular lacunar infarct. There is no acute intracranial hemorrhage. No mass effect or midline shift. No extra-axial fluid collection. Vascular: No hyperdense vessel or unexpected calcification. Skull: Normal. Negative for fracture or focal lesion.  Sinuses/Orbits: No acute finding. Other: None IMPRESSION: 1. No acute intracranial pathology. 2. Moderate age-related atrophy and chronic microvascular ischemic changes. Electronically Signed   By: Vanetta Chou M.D.   On: 03/09/2023 16:16   DG Chest 2 View Result Date: 03/09/2023 CLINICAL DATA:  Dyspnea EXAM: CHEST - 2 VIEW COMPARISON:  08/01/2010 chest radiograph. FINDINGS: Stable cardiomediastinal silhouette with mild cardiomegaly. No pneumothorax. No pleural effusion. No overt pulmonary edema. Streaky and patchy mild bibasilar lung opacities. IMPRESSION: 1. Mild cardiomegaly. No overt pulmonary edema. 2. Streaky and patchy mild bibasilar lung opacities, favor atelectasis, difficult to exclude a component of aspiration or pneumonia. Electronically Signed   By: Selinda DELENA Blue M.D.   On: 03/09/2023 15:02    Assessment and Plan: This is a 83 y.o. male with perforated cholecystitis, bladder outlet obstruction, overall fatigue  --Discussed with the patient the findings on his imaging studies.  Overall the gallbladder is very inflamed, with concern for perforation of the fundus resulting in an abscess.  Discussed with the patient that given his recent stroke, being on dual antiplatelet therapy, and with such inflammatory response in the RUQ, surgery is contraindicated at this point.  It would be best to proceed with a percutaneous cholecystostomy drain placement, and Interventional Radiology has been consulted by the ED.   --Recommend IV antibiotics, for now keeping him NPO, with IV fluid hydration.  May be able to start a diet tomorrow morning depending how he's doing clinically. --Discussed with patient that he would need this drain in place for quite some time, until it's appropriate to be off his Plavix  at least.  He may instead need referral to IR for spyglass procedure.  I spent 60 minutes dedicated to the care of this patient on the  date of this encounter to include pre-visit review of records,  face-to-face time with the patient discussing diagnosis and management, and any post-visit coordination of care.   Aloysius Sheree Plant, MD [facility]:  [phone]

## 2023-03-09 NOTE — Assessment & Plan Note (Signed)
 Blood cultures x 2 are in process Urine culture in process Patient has increased lactic acid, and source of infection are gallbladder, pneumonia Added azithromycin  500 mg IV daily, to complete 5-day course for atypical pneumonia coverage Patient currently has MAP greater than 65, given IV fluids shortage, no indication for fluid bolus at this time Will add additional LR 1 L bolus on admission Continue with LR infusion at 150 mL/h, 20 hours ordered

## 2023-03-10 DIAGNOSIS — K82A2 Perforation of gallbladder in cholecystitis: Secondary | ICD-10-CM | POA: Diagnosis not present

## 2023-03-10 DIAGNOSIS — K750 Abscess of liver: Secondary | ICD-10-CM | POA: Insufficient documentation

## 2023-03-10 DIAGNOSIS — K810 Acute cholecystitis: Secondary | ICD-10-CM

## 2023-03-10 DIAGNOSIS — A419 Sepsis, unspecified organism: Secondary | ICD-10-CM | POA: Diagnosis not present

## 2023-03-10 DIAGNOSIS — E876 Hypokalemia: Secondary | ICD-10-CM

## 2023-03-10 DIAGNOSIS — R6520 Severe sepsis without septic shock: Secondary | ICD-10-CM | POA: Diagnosis not present

## 2023-03-10 LAB — CBG MONITORING, ED: Glucose-Capillary: 123 mg/dL — ABNORMAL HIGH (ref 70–99)

## 2023-03-10 LAB — CBC
HCT: 33.2 % — ABNORMAL LOW (ref 39.0–52.0)
Hemoglobin: 11.6 g/dL — ABNORMAL LOW (ref 13.0–17.0)
MCH: 29.9 pg (ref 26.0–34.0)
MCHC: 34.9 g/dL (ref 30.0–36.0)
MCV: 85.6 fL (ref 80.0–100.0)
Platelets: 89 10*3/uL — ABNORMAL LOW (ref 150–400)
RBC: 3.88 MIL/uL — ABNORMAL LOW (ref 4.22–5.81)
RDW: 13.6 % (ref 11.5–15.5)
WBC: 11.2 10*3/uL — ABNORMAL HIGH (ref 4.0–10.5)
nRBC: 0 % (ref 0.0–0.2)

## 2023-03-10 LAB — URINE CULTURE

## 2023-03-10 LAB — HEPATIC FUNCTION PANEL
ALT: 33 U/L (ref 0–44)
AST: 40 U/L (ref 15–41)
Albumin: 2 g/dL — ABNORMAL LOW (ref 3.5–5.0)
Alkaline Phosphatase: 68 U/L (ref 38–126)
Bilirubin, Direct: 0.4 mg/dL — ABNORMAL HIGH (ref 0.0–0.2)
Indirect Bilirubin: 0.4 mg/dL (ref 0.3–0.9)
Total Bilirubin: 0.8 mg/dL (ref 0.0–1.2)
Total Protein: 4.9 g/dL — ABNORMAL LOW (ref 6.5–8.1)

## 2023-03-10 LAB — PROTIME-INR
INR: 1.3 — ABNORMAL HIGH (ref 0.8–1.2)
Prothrombin Time: 16.5 s — ABNORMAL HIGH (ref 11.4–15.2)

## 2023-03-10 LAB — BASIC METABOLIC PANEL
Anion gap: 9 (ref 5–15)
BUN: 23 mg/dL (ref 8–23)
CO2: 32 mmol/L (ref 22–32)
Calcium: 7.9 mg/dL — ABNORMAL LOW (ref 8.9–10.3)
Chloride: 104 mmol/L (ref 98–111)
Creatinine, Ser: 0.68 mg/dL (ref 0.61–1.24)
GFR, Estimated: >60 mL/min (ref >60)
Glucose, Bld: 113 mg/dL — ABNORMAL HIGH (ref 70–99)
Potassium: 2.5 mmol/L — CL (ref 3.5–5.1)
Sodium: 145 mmol/L (ref 135–145)

## 2023-03-10 LAB — PROCALCITONIN: Procalcitonin: 3.97 ng/mL

## 2023-03-10 LAB — CK: Total CK: 480 U/L — ABNORMAL HIGH (ref 49–397)

## 2023-03-10 LAB — LACTIC ACID, PLASMA: Lactic Acid, Venous: 1.5 mmol/L (ref 0.5–1.9)

## 2023-03-10 MED ORDER — POTASSIUM CHLORIDE 2 MEQ/ML IV SOLN
INTRAVENOUS | Status: DC
  Filled 2023-03-10: qty 1000

## 2023-03-10 MED ORDER — POTASSIUM CHLORIDE 10 MEQ/100ML IV SOLN
10.0000 meq | INTRAVENOUS | Status: AC
  Administered 2023-03-10 (×6): 10 meq via INTRAVENOUS
  Filled 2023-03-10 (×6): qty 100

## 2023-03-10 MED ORDER — KCL-LACTATED RINGERS-D5W 20 MEQ/L IV SOLN
INTRAVENOUS | Status: AC
  Filled 2023-03-10 (×2): qty 1000

## 2023-03-10 NOTE — Progress Notes (Signed)
 03/10/2023  Subjective: Patient had percutaneous cholecystostomy drain placed yesterday by IR.  Appreciate their prompt assistance with this patient.  Overall had issues overnight with O2 desaturation and concerns for fluid overload.  His lactic acid has normalized, WBC improving, K still quite low at 2.5.  Denies any abdominal pain, but he appears more tired/sleepy this morning.  Vital signs: Temp:  [97.3 F (36.3 C)-98.4 F (36.9 C)] 97.7 F (36.5 C) (02/09 0741) Pulse Rate:  [58-89] 84 (02/09 0900) Resp:  [12-26] 20 (02/09 0900) BP: (113-151)/(54-76) 134/60 (02/09 0900) SpO2:  [88 %-100 %] 93 % (02/09 0900) Weight:  [95.3 kg] 95.3 kg (02/08 2053)   Intake/Output: 02/08 0701 - 02/09 0700 In: 10  Out: 4485 [Urine:4300; Drains:135]    Physical Exam: Constitutional: No acute distress, tired. Abdomen:  soft, non-distended, does not appear tender to palpation at this point.  Drain in place with bilious fluid.  Labs:  Recent Labs    03/09/23 1414 03/10/23 0419  WBC 14.7* 11.2*  HGB 10.5* 11.6*  HCT 32.6* 33.2*  PLT 97* 89*   Recent Labs    03/09/23 1414 03/10/23 0419  NA 142 145  K 2.2* 2.5*  CL 106 104  CO2 25 32  GLUCOSE 150* 113*  BUN 31* 23  CREATININE 0.98 0.68  CALCIUM  6.9* 7.9*   Recent Labs    03/10/23 0419  LABPROT 16.5*  INR 1.3*    Imaging: IR Perc Cholecystostomy Result Date: 03/10/2023 INDICATION: 83 year old male with acute gangrenous cholecystitis. He presents for cholecystostomy tube placement. EXAM: CHOLECYSTOSTOMY MEDICATIONS: Currently receiving intravenous antibiotics. No additional prophylaxis was administered. ANESTHESIA/SEDATION: Moderate (conscious) sedation was employed during this procedure. A total of Versed  1 mg and Fentanyl  50 mcg was administered intravenously by the Radiology nurse. Moderate Sedation Time: 18 minutes. The patient's level of consciousness and vital signs were monitored continuously by radiology nursing throughout the  procedure under my direct supervision. FLUOROSCOPY TIME:  Radiation exposure index: 11 mGy reference air kerma COMPLICATIONS: None immediate. PROCEDURE: Informed written consent was obtained from the patient after a thorough discussion of the procedural risks, benefits and alternatives. All questions were addressed. Maximal Sterile Barrier Technique was utilized including caps, mask, sterile gowns, sterile gloves, sterile drape, hand hygiene and skin antiseptic. A timeout was performed prior to the initiation of the procedure. The right upper quadrant was interrogated with ultrasound. The distended and inflamed gallbladder was successfully visualized. A suitable skin entry site was selected and marked. Local anesthesia was attained by infiltration with 1% lidocaine . A small dermatotomy was made. Under real-time ultrasound guidance, a 21 gauge needle was advanced into the gallbladder lumen the utilizing a trans peritoneal approach. The trans peritoneal approach was selected due to the patient being on dual antiplatelet therapy. The wire was coiled in the gallbladder lumen. The needle was removed. The transitional dilator was advanced over the wire and into the gallbladder lumen. The microwire was removed and exchanged for a 0.035 Amplatz wire. The percutaneous tract was dilated to 10 French. A 10 French all-purpose drain was then advanced over the wire and formed in the gallbladder. There was return of black bile and debris. A sample was reserved and sent to the lab for Gram stain and culture. Contrast was injected through the tube opacifying the gallbladder lumen. Images were obtained and stored for the medical record. The tube was gently flushed and connected to gravity bag drainage. The tube was secured to the skin with 0 Prolene suture. IMPRESSION: Successful placement  of trans peritoneal percutaneous cholecystostomy tube for acute gangrenous cholecystitis. The trans peritoneal approach was selected based on the  fact that the patient is currently on dual antiplatelet therapy which poses a higher risk for bleeding with a transhepatic approach. Electronically Signed   By: Wilkie Lent M.D.   On: 03/10/2023 08:46   CT ABDOMEN PELVIS W CONTRAST Result Date: 03/09/2023 CLINICAL DATA:  Fever and abdominal tendinous. EXAM: CT ABDOMEN AND PELVIS WITH CONTRAST TECHNIQUE: Multidetector CT imaging of the abdomen and pelvis was performed using the standard protocol following bolus administration of intravenous contrast. RADIATION DOSE REDUCTION: This exam was performed according to the departmental dose-optimization program which includes automated exposure control, adjustment of the mA and/or kV according to patient size and/or use of iterative reconstruction technique. CONTRAST:  OMNIPAQUE  IOHEXOL  300 MG/ML  SOLN COMPARISON:  CT abdomen pelvis dated February 24, 2018. FINDINGS: Lower chest: No acute abnormality.  Bibasilar atelectasis. Hepatobiliary: Gallbladder with mild irregular wall thickening and suspected wall discontinuity at the fundus. Small gallstone seen near the gallbladder neck. 2.8 x 3.4 cm irregular low-density lesion with subtle peripheral enhancement in the inferior liver adjacent to the gallbladder fundus (series 3, image 26). Pericholecystic inflammatory change near the gallbladder neck. No other focal liver abnormality. No biliary dilatation. Pancreas: Unremarkable. No pancreatic ductal dilatation or surrounding inflammatory changes. Spleen: Normal in size without focal abnormality. Adrenals/Urinary Tract: 1 cm left adrenal nodule unchanged since 2020, consistent with benign adenoma. Insert follow-up normal right adrenal gland. No renal calculi or mass. Mild bilateral hydroureteronephrosis with markedly distended bladder. Stomach/Bowel: Stomach is within normal limits. Appendix appears normal. No evidence of bowel wall thickening, distention, or inflammatory changes. Diffuse colonic diverticulosis.  Vascular/Lymphatic: Aortic atherosclerosis. No enlarged abdominal or pelvic lymph nodes. Reproductive: Mild prostatomegaly with TURP defect noted. Other: No abdominal wall hernia or abnormality. No abdominopelvic ascites. No pneumoperitoneum. Musculoskeletal: No acute or significant osseous findings. IMPRESSION: 1. Acute cholecystitis with probable gallbladder perforation at the fundus. 3.4 cm irregular low-density lesion with subtle peripheral enhancement in the inferior liver adjacent to the gallbladder fundus, concerning for developing hepatic abscess. 2. Mild bilateral hydroureteronephrosis with markedly distended bladder, consistent with bladder outlet obstruction 3.  Aortic Atherosclerosis (ICD10-I70.0). Electronically Signed   By: Elsie ONEIDA Shoulder M.D.   On: 03/09/2023 16:24   CT HEAD WO CONTRAST ( ) Result Date: 03/09/2023 CLINICAL DATA:  Recent severe. Evaluate for intracranial hemorrhage. EXAM: CT HEAD WITHOUT CONTRAST TECHNIQUE: Contiguous axial images were obtained from the base of the skull through the vertex without intravenous contrast. RADIATION DOSE REDUCTION: This exam was performed according to the departmental dose-optimization program which includes automated exposure control, adjustment of the mA and/or kV according to patient size and/or use of iterative reconstruction technique. COMPARISON:  Head CT dated 02/11/2023. FINDINGS: Brain: Moderate age-related atrophy and chronic microvascular ischemic changes. Old appearing small right periventricular lacunar infarct. There is no acute intracranial hemorrhage. No mass effect or midline shift. No extra-axial fluid collection. Vascular: No hyperdense vessel or unexpected calcification. Skull: Normal. Negative for fracture or focal lesion. Sinuses/Orbits: No acute finding. Other: None IMPRESSION: 1. No acute intracranial pathology. 2. Moderate age-related atrophy and chronic microvascular ischemic changes. Electronically Signed   By: Vanetta Chou M.D.   On: 03/09/2023 16:16   DG Chest 2 View Result Date: 03/09/2023 CLINICAL DATA:  Dyspnea EXAM: CHEST - 2 VIEW COMPARISON:  08/01/2010 chest radiograph. FINDINGS: Stable cardiomediastinal silhouette with mild cardiomegaly. No pneumothorax. No pleural effusion. No overt pulmonary edema.  Streaky and patchy mild bibasilar lung opacities. IMPRESSION: 1. Mild cardiomegaly. No overt pulmonary edema. 2. Streaky and patchy mild bibasilar lung opacities, favor atelectasis, difficult to exclude a component of aspiration or pneumonia. Electronically Signed   By: Selinda DELENA Blue M.D.   On: 03/09/2023 15:02    Assessment/Plan: This is a 83 y.o. male with acute perforated cholecystitis.  --Patient had percutaneous cholecystostomy drain placed yesterday.  Cultures from bile drained currently showing moderate GNR.  Continue broad spectrum abx for now. --Will start him on a clear liquid diet this morning.  If he tolerates this well, could advance to full liquids for dinner. --Flush drain with 5 ml NS q8hrs.   I spent 35 minutes dedicated to the care of this patient on the date of this encounter to include pre-visit review of records, face-to-face time with the patient discussing diagnosis and management, and any post-visit coordination of care.  Aloysius Sheree Plant, MD [facility]

## 2023-03-10 NOTE — ED Notes (Signed)
 RN decreased Francisco Reid to 2L. Maintaining in the 90s.

## 2023-03-10 NOTE — Progress Notes (Addendum)
 Progress Note   Patient: Francisco Reid FMW:[id number] DOB: March 31, 1940 DOA: 03/09/2023     1 DOS: the patient was seen and examined on 03/10/2023   Brief hospital course: Francisco Reid is an 83 year old male with history of recent left lacunar infarct, history of CAD status post DES in 2014 currently on Plavix , chronic sinus bradycardia, history of dysphagia status post dilatation per [facility] GI, gastritis, hypercholesterolemia, hypertension, thyroid  nodule, who presents to the emergency department for chief concerns of abdominal pain, fever, altered mental status.  Patient has been too weak and unable to ambulate for EMS.  WBC 14.7, hemoglobin 10.5, platelets of 97. BNP was elevated 183.5.  CK elevated at 1525.-C troponin is 60.  Procalcitonin 6.08.  Lactic acid elevated at 2.6 and on repeat was 3.0.  CT abdomen/pelvis showed acute cholecystitis with perforation, 3 cm liver abscess. Patient was seen by general surgery, has cholecystostomy performed by IR.  Antibiotic continued on Zosyn .  Culture from cholecystostomy showed gram-negative rods.  Blood cultures so far no growth.    Principal Problem:   Severe sepsis with acute organ dysfunction (HCC) Active Problems:   Acute cholecystitis   CVA (cerebral vascular accident) (HCC)   Hypokalemia   CAD S/P percutaneous coronary angioplasty   Essential hypertension   Hyperlipidemia   Elevated LFTs   Elevated CK   CAP (community acquired pneumonia)   Cholecystitis with perforation of gallbladder   Liver abscess   Assessment and Plan: * Severe sepsis with acute organ dysfunction (HCC) Acute acute cholecystitis with cholelithiasis with a perforated gallbladder. Liver abscess secondary to perforated gallbladder. Community acquired pneumonia ruled out. Patient met sepsis criteria with leukocytosis, tachypnea and occasional heart rate over 90.  Lactic acidosis of 3.0.  This is due to acute cholecystitis with perforation. Patient has been seen by  general surgery, IR has performed cholecystostomy, culture from drain positive gram-negative rods, blood cultures so far has no growth. At this point, I will continue antibiotics with Zosyn . I personally reviewed patient chest x-ray and CT scan results, chest x-ray showed bilateral lower lobe atelectasis, but CT scan of abdomen/pelvis did not show any significant abnormality in the lower lobes.  As a result, pneumonia has been ruled out. Due to patient advanced age, and perforated gallbladder with severe infection.  Patient prognosis is guarded.  Will follow closely in progressive unit. Continue n.p.o., with IV fluids.  Acute rhabdomyolysis, nontraumatic. Patient received IV fluids, CK level has dropped down.  Acute hypoxic respiratory failure after fluids. Chronic combined systolic and diastolic congestive heart failure. Patient developed cough and some short of breath, hypoxia after fluids.  IV fluid rate was decreased.  Recent echocardiac cardiogram performed in 02/12/2023 showed ejection fraction 45 to 50% with grade 1 diastolic dysfunction. Patient still on 2 L oxygen, but respite status is better.  Will continue to monitor closely, give Lasix  as needed.  Severe hypokalemia. Repleted potassium through IV, also added potassium into the IV fluids.  Recheck level tomorrow.  Magnesium level 2.3.  Essential hypertension Continue as needed blood pressure medicine.  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 Gross hematuria. CT scan also showed bladder outlet obstruction.  Foley catheter was anchored, patient also developed some hematuria, continue to follow.    Subjective:  Patient is very sleepy today, denies any short of breath.  Still required 2 L oxygen.  No significant abdominal pain and nausea vomiting.  Physical  Exam: Vitals:   03/10/23 0500 03/10/23 0600 03/10/23 0741 03/10/23 0900  BP: 123/63 116/66  134/60  Pulse: 60 (!) 59  84  Resp: 12 13  20   Temp:   97.7 F (36.5 C)   TempSrc:   Axillary   SpO2: 93% 91%  93%  Weight:       General exam: Appears calm and comfortable  Respiratory system: Clear to auscultation. Respiratory effort normal. Cardiovascular system: S1 & S2 heard, RRR. No JVD, murmurs, rubs, gallops or clicks. No pedal edema. Gastrointestinal system: Abdomen is nondistended, soft and nontender. No organomegaly or masses felt. Normal bowel sounds heard. Central nervous system: Drowsy and oriented x2. No focal neurological deficits. Extremities: Symmetric 5 x 5 power. Skin: No rashes, lesions or ulcers Psychiatry: Mood & affect appropriate.    Data Reviewed:  Reviewed CT scan results, also reviewed CT scan imaging and chest x-ray image.  Reviewed lab results.  Family Communication: Sister updated in the room, all questions answered.  Disposition: Status is: Inpatient Remains inpatient appropriate because: Severity of disease, IV treatment. High risk of deterioration.     Time spent: 60 minutes  Author: Murvin Mana, MD 03/10/2023 10:17 AM  For on call review [email or web address].

## 2023-03-10 NOTE — Progress Notes (Signed)
       CROSS COVER NOTE  NAME: Francisco Reid MRN: [id number] DOB : 05-05-1940 ATTENDING PHYSICIAN: Cox, Amy LOISE, DO    Date of Service   03/10/2023   HPI/Events of Note   Nurse reports signs of fluid overload   Interventions   Assessment/Plan: Resp non labored but rales/crackles bll and course rhonchi uppper lobes. Poor cough clearing of secretions until bed role during assessment. No increased resp distress with head down but still dependent on 2 [place]  Change  fluids LR 40 K at 40 while npo Vbg Npo till cleared by surgery        Francisco Reid [facility] NP [facility] Cross Cover 7pm-7am - check amion for availability Pager [phone]

## 2023-03-10 NOTE — ED Notes (Signed)
 RN assisting patient to eat lunch. Patient intake 240 of water and 1/3 cup of beef broth. Patient nauseas, no vomit episodes. Patient Alert and communicating with RN.

## 2023-03-10 NOTE — ED Notes (Addendum)
 Flushed Drainage Catheter Per order with 5mL NS.

## 2023-03-10 NOTE — Progress Notes (Addendum)
 Patient pulling at lines when being transferred to bed in cpod. Once patient settled patient had some blood around urethra and on brief. Perineum cleaned. No further bleeding noted from penis or urethra. Evidence of pink tinged urine in foley bag but clear yellow urine draining through foley catheter presently. Biliary tube patent and draining dark brown drainage. Output documented. Tube flushed with 5cc Ns per order. Flushed easily with no resistance.

## 2023-03-10 NOTE — ED Notes (Signed)
 Patient oxygen saturation went down to mid 80s. RN went into patient room and applied @L  [place] and oxygen saturation would not go up. RN turned up to 6L and patients O2 now low 90s.

## 2023-03-10 NOTE — ED Notes (Signed)
 MD Zhang at bedside speaking with family.

## 2023-03-10 NOTE — Progress Notes (Addendum)
 Patient having increased congestion, rhonchi. Patient sat up in bed. Attempted to get him to cough to see if he could clear some of the secretions. Patient coughed a little but cough was weak. Patient currently with IVF at 150ml/hr. Currently requiring oxygen. This RN had concerns that patient was ordered regular diet despite notes from 2 providers stating patient should be NPO. Patient had not received anything on my shift. Patient is diabetic and blood sugar was checked. Notified provider B. Jesus. She was already on unit and went to see patient in person. Orders entered to decrease fluids. Other new orders placed for this patient as well. Lactic ordered at this time. Per Erminio ok to do later with morning labs as this does not need to be stat.

## 2023-03-10 NOTE — ED Notes (Signed)
 Patient refused to wear oxygen. O2 saturation 92% on RA. Patient turned onto right side, refused to let RN re-apply respiratory lead.

## 2023-03-10 NOTE — Progress Notes (Signed)
 Notified provider that urine is cherry colored this morning in catheter. it was pink tinged off and on but is consistently dark cherry now. Pt was on plavix  and aspirin  at home with low platelet count. inr is 1.3 and plts are 89. No clots present. Per Provider color is older blood and no clots is good.   0710-Information shared with day Rn during shift report.

## 2023-03-10 NOTE — Progress Notes (Signed)
 Notified provider repeat lactic improved to 1.5 and k was up to 2.5 from 2.2. new orders placed for iv kcl.

## 2023-03-11 ENCOUNTER — Encounter: Payer: Self-pay | Admitting: Internal Medicine

## 2023-03-11 DIAGNOSIS — R6520 Severe sepsis without septic shock: Secondary | ICD-10-CM | POA: Diagnosis not present

## 2023-03-11 DIAGNOSIS — K82A2 Perforation of gallbladder in cholecystitis: Secondary | ICD-10-CM | POA: Diagnosis not present

## 2023-03-11 DIAGNOSIS — A419 Sepsis, unspecified organism: Secondary | ICD-10-CM | POA: Diagnosis not present

## 2023-03-11 DIAGNOSIS — E876 Hypokalemia: Secondary | ICD-10-CM | POA: Diagnosis not present

## 2023-03-11 LAB — COMPREHENSIVE METABOLIC PANEL
ALT: 33 U/L (ref 0–44)
AST: 36 U/L (ref 15–41)
Albumin: 2.1 g/dL — ABNORMAL LOW (ref 3.5–5.0)
Alkaline Phosphatase: 80 U/L (ref 38–126)
Anion gap: 9 (ref 5–15)
BUN: 18 mg/dL (ref 8–23)
CO2: 32 mmol/L (ref 22–32)
Calcium: 7.9 mg/dL — ABNORMAL LOW (ref 8.9–10.3)
Chloride: 103 mmol/L (ref 98–111)
Creatinine, Ser: 0.6 mg/dL — ABNORMAL LOW (ref 0.61–1.24)
GFR, Estimated: >60 mL/min (ref >60)
Glucose, Bld: 108 mg/dL — ABNORMAL HIGH (ref 70–99)
Potassium: 3.1 mmol/L — ABNORMAL LOW (ref 3.5–5.1)
Sodium: 144 mmol/L (ref 135–145)
Total Bilirubin: 0.7 mg/dL (ref 0.0–1.2)
Total Protein: 5.3 g/dL — ABNORMAL LOW (ref 6.5–8.1)

## 2023-03-11 LAB — MAGNESIUM: Magnesium: 1.9 mg/dL (ref 1.7–2.4)

## 2023-03-11 LAB — CBC
HCT: 39.2 % (ref 39.0–52.0)
Hemoglobin: 13.1 g/dL (ref 13.0–17.0)
MCH: 29.4 pg (ref 26.0–34.0)
MCHC: 33.4 g/dL (ref 30.0–36.0)
MCV: 88.1 fL (ref 80.0–100.0)
Platelets: 93 10*3/uL — ABNORMAL LOW (ref 150–400)
RBC: 4.45 MIL/uL (ref 4.22–5.81)
RDW: 13.5 % (ref 11.5–15.5)
WBC: 12.5 10*3/uL — ABNORMAL HIGH (ref 4.0–10.5)
nRBC: 0 % (ref 0.0–0.2)

## 2023-03-11 LAB — PHOSPHORUS: Phosphorus: 2 mg/dL — ABNORMAL LOW (ref 2.5–4.6)

## 2023-03-11 MED ORDER — DEXTROSE 5 % IV SOLN
30.0000 mmol | Freq: Once | INTRAVENOUS | Status: AC
  Administered 2023-03-11: 30 mmol via INTRAVENOUS
  Filled 2023-03-11: qty 10

## 2023-03-11 MED ORDER — POTASSIUM CHLORIDE 10 MEQ/100ML IV SOLN
10.0000 meq | INTRAVENOUS | Status: AC
Start: 2023-03-11 — End: 2023-03-11
  Administered 2023-03-11 (×2): 10 meq via INTRAVENOUS
  Filled 2023-03-11 (×2): qty 100

## 2023-03-11 NOTE — ED Notes (Signed)
Informed RN bed assigned 

## 2023-03-11 NOTE — Progress Notes (Signed)
 Progress Note   Patient: Francisco Reid [id number]:[id number] DOB: Jan 16, 1941 DOA: 03/09/2023     2 DOS: the patient was seen and examined on 03/11/2023   Brief hospital course: [name] is an 83 year old male with history of recent left lacunar infarct, history of CAD status post DES in 2014 currently on Plavix , chronic sinus bradycardia, history of dysphagia status post dilatation per [facility] GI, gastritis, hypercholesterolemia, hypertension, thyroid  nodule, who presents to the emergency department for chief concerns of abdominal pain, fever, altered mental status.  Patient has been too weak and unable to ambulate for EMS.  WBC 14.7, hemoglobin 10.5, platelets of 97. BNP was elevated 183.5.  CK elevated at 1525.-C troponin is 60.  Procalcitonin 6.08.  Lactic acid elevated at 2.6 and on repeat was 3.0.  CT abdomen/pelvis showed acute cholecystitis with perforation, 3 cm liver abscess. Patient was seen by general surgery, has cholecystostomy performed by IR.  Antibiotic continued on Zosyn .  Culture from cholecystostomy showed gram-negative rods.  Blood cultures so far no growth.    Principal Problem:   Severe sepsis with acute organ dysfunction (HCC) Active Problems:   Acute cholecystitis   CVA (cerebral vascular accident) (HCC)   Hypokalemia   CAD S/P percutaneous coronary angioplasty   Essential hypertension   Hyperlipidemia   Elevated LFTs   Elevated CK   Cholecystitis with perforation of gallbladder   Liver abscess   Hypophosphatemia   Assessment and Plan:   Severe sepsis with acute organ dysfunction (HCC) Acute acute cholecystitis with cholelithiasis with a perforated gallbladder. Liver abscess secondary to perforated gallbladder. Community acquired pneumonia ruled out. Patient met sepsis criteria with leukocytosis, tachypnea and occasional heart rate over 90.  Lactic acidosis of 3.0.  This is due to acute cholecystitis with perforation. Patient has been seen by general surgery, IR  has performed cholecystostomy, culture from drain grew Klebsiella pneumoniae, blood cultures no growth for 2 days.  Blood culture no growth for 2 days. I personally reviewed patient chest x-ray and CT scan results, chest x-ray showed bilateral lower lobe atelectasis, but CT scan of abdomen/pelvis did not show any significant abnormality in the lower lobes.  As a result, pneumonia has been ruled out. Due to patient advanced age, and perforated gallbladder with severe infection.  Patient prognosis is guarded.   Patient is followed by general surgery, started liquid diet as patient condition appears to be improving.  Continue Zosyn .   Acute rhabdomyolysis, nontraumatic. Patient received IV fluids, CK level has dropped down.   Acute hypoxic respiratory failure after fluids. Chronic combined systolic and diastolic congestive heart failure. Patient developed cough and some short of breath, hypoxia after fluids.  IV fluid rate was decreased.  Recent echocardiac cardiogram performed in 02/12/2023 showed ejection fraction 45 to 50% with grade 1 diastolic dysfunction. Patient still on 2 L oxygen, but no signal short of breath.  Do not have volume overload.   Severe hypokalemia. Hypophosphatemia. Replete potassium and phosphate today.   Essential hypertension Continue as needed blood pressure medicine.   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 Gross hematuria. CT scan also showed bladder outlet obstruction.  Foley catheter was anchored, gross hematuria improving.  Urine culture showed multiple species, not consistent with UTI.       Subjective:  Patient feels better, denies any abdominal pain.  No shortness of breath.  Physical Exam: Vitals:   03/11/23 1002 03/11/23 1008 03/11/23 1100 03/11/23 1300  BP: 122/62  (!) 140/68 (!)  148/76  Pulse: (!) 57  72 73  Resp: 15  19 18   Temp:  98.6 F (37 C)    TempSrc:      SpO2: 93%  (!) 89% 91%  Weight:      Height:       General exam: Appears calm and comfortable  Respiratory system: Clear to auscultation. Respiratory effort normal. Cardiovascular system: S1 & S2 heard, RRR. No JVD, murmurs, rubs, gallops or clicks. No pedal edema. Gastrointestinal system: Abdomen is nondistended, soft and nontender. No organomegaly or masses felt. Normal bowel sounds heard. Central nervous system: Alert and oriented x3. No focal neurological deficits. Extremities: Symmetric 5 x 5 power. Skin: No rashes, lesions or ulcers Psychiatry:  Mood & affect appropriate.    Data Reviewed:  Lab results reviewed.  Family Communication: None  Disposition: Status is: Inpatient Remains inpatient appropriate because: Severity of disease, IV antibiotics.  Procedure.     Time spent: 35 minutes  Author: Donaciano Frizzle, MD 03/11/2023 3:19 PM  For on call review [email or web address].

## 2023-03-11 NOTE — Progress Notes (Signed)
 03/11/2023  Subjective: No acute events.  Patient is more awake this morning.  Denies any abdominal pain or nausea.  Tolerated clear liquids well.  Drain cultures showing moderate GNR.  Vital signs: Temp:  [97.6 F (36.4 C)-98.6 F (37 C)] 98.6 F (37 C) (02/10 1008) Pulse Rate:  [56-73] 73 (02/10 1300) Resp:  [13-19] 18 (02/10 1300) BP: (113-148)/(53-76) 148/76 (02/10 1300) SpO2:  [89 %-96 %] 91 % (02/10 1300)   Intake/Output: 02/09 0701 - 02/10 0700 In: 189.4 [I.V.:5; IV Piggyback:184.4] Out: 1510 [Urine:1400; Drains:60]    Physical Exam: Constitutional: No acute distress, tired. Abdomen:  soft, non-distended, non-tender to palpation.  Drain in place with bilious fluid.  Labs:  Recent Labs    03/10/23 0419 03/11/23 0611  WBC 11.2* 12.5*  HGB 11.6* 13.1  HCT 33.2* 39.2  PLT 89* 93*   Recent Labs    03/10/23 0419 03/11/23 0611  NA 145 144  K 2.5* 3.1*  CL 104 103  CO2 32 32  GLUCOSE 113* 108*  BUN 23 18  CREATININE 0.68 0.60*  CALCIUM  7.9* 7.9*   Recent Labs    03/10/23 0419  LABPROT 16.5*  INR 1.3*    Imaging: No results found.   Assessment/Plan: This is a 83 y.o. male with acute perforated cholecystitis.  --Patient is clinically doing better.  He's more awake, and denies any pain or nausea.  Tolerated clear liquids. --Will advance to full liquids for breakfast and can have soft diet for dinner. --Drain cultures currently pending, showing GNR. --Flush drain with 5 ml NS q8hrs. --Potentially could be discharged tomorrow from general surgery/gallbladder standpoint.   I spent 35 minutes dedicated to the care of this patient on the date of this encounter to include pre-visit review of records, face-to-face time with the patient discussing diagnosis and management, and any post-visit coordination of care.  Marene Shape, MD [facility]

## 2023-03-12 ENCOUNTER — Other Ambulatory Visit (HOSPITAL_COMMUNITY): Payer: Self-pay | Admitting: Radiology

## 2023-03-12 DIAGNOSIS — K82A2 Perforation of gallbladder in cholecystitis: Secondary | ICD-10-CM | POA: Diagnosis not present

## 2023-03-12 DIAGNOSIS — A419 Sepsis, unspecified organism: Secondary | ICD-10-CM | POA: Diagnosis not present

## 2023-03-12 DIAGNOSIS — E663 Overweight: Secondary | ICD-10-CM | POA: Insufficient documentation

## 2023-03-12 DIAGNOSIS — K750 Abscess of liver: Secondary | ICD-10-CM | POA: Diagnosis not present

## 2023-03-12 DIAGNOSIS — K810 Acute cholecystitis: Secondary | ICD-10-CM | POA: Diagnosis not present

## 2023-03-12 LAB — BASIC METABOLIC PANEL
Anion gap: 10 (ref 5–15)
BUN: 12 mg/dL (ref 8–23)
CO2: 27 mmol/L (ref 22–32)
Calcium: 7.4 mg/dL — ABNORMAL LOW (ref 8.9–10.3)
Chloride: 101 mmol/L (ref 98–111)
Creatinine, Ser: 0.48 mg/dL — ABNORMAL LOW (ref 0.61–1.24)
GFR, Estimated: >60 mL/min (ref >60)
Glucose, Bld: 90 mg/dL (ref 70–99)
Potassium: 3.7 mmol/L (ref 3.5–5.1)
Sodium: 138 mmol/L (ref 135–145)

## 2023-03-12 LAB — MAGNESIUM: Magnesium: 1.8 mg/dL (ref 1.7–2.4)

## 2023-03-12 LAB — PHOSPHORUS: Phosphorus: 2.5 mg/dL (ref 2.5–4.6)

## 2023-03-12 MED ORDER — TAMSULOSIN HCL 0.4 MG PO CAPS
0.4000 mg | ORAL_CAPSULE | Freq: Every day | ORAL | Status: DC
  Administered 2023-03-12 – 2023-03-19 (×8): 0.4 mg via ORAL
  Filled 2023-03-12 (×8): qty 1

## 2023-03-12 MED ORDER — CHLORHEXIDINE GLUCONATE CLOTH 2 % EX PADS: 6.0000 | MEDICATED_PAD | Freq: Every day | CUTANEOUS | Status: DC

## 2023-03-12 MED ORDER — CHLORHEXIDINE GLUCONATE CLOTH 2 % EX PADS
6.0000 | MEDICATED_PAD | Freq: Every day | CUTANEOUS | Status: DC
  Administered 2023-03-12 – 2023-03-18 (×7): 6 via TOPICAL

## 2023-03-12 MED ORDER — SODIUM CHLORIDE 0.9 % IV SOLN
3.0000 g | Freq: Four times a day (QID) | INTRAVENOUS | Status: DC
  Administered 2023-03-12 – 2023-03-18 (×22): 3 g via INTRAVENOUS
  Filled 2023-03-12 (×26): qty 8

## 2023-03-12 MED ORDER — SODIUM CHLORIDE 0.9% FLUSH: 5.0000 mL | Freq: Every day | INTRAVENOUS | 0 refills | Status: AC

## 2023-03-12 MED ORDER — FUROSEMIDE 10 MG/ML IJ SOLN
40.0000 mg | Freq: Once | INTRAMUSCULAR | Status: AC
Start: 2023-03-12 — End: 2023-03-12
  Administered 2023-03-12: 40 mg via INTRAVENOUS
  Filled 2023-03-12: qty 4

## 2023-03-12 NOTE — Progress Notes (Addendum)
Progress Note   Patient: Francisco Reid UJW:[id number] DOB: 1941-01-13 DOA: 03/09/2023     3 DOS: the patient was seen and examined on 03/12/2023   Brief hospital course: Mr. Talan Gildner is an 83 year old male with history of recent left lacunar infarct, history of CAD status post DES in 2014 currently on Plavix, chronic sinus bradycardia, history of dysphagia status post dilatation per [facility] GI, gastritis, hypercholesterolemia, hypertension, thyroid nodule, who presents to the emergency department for chief concerns of abdominal pain, fever, altered mental status.  Patient has been too weak and unable to ambulate for EMS.  WBC 14.7, hemoglobin 10.5, platelets of 97. BNP was elevated 183.5.  CK elevated at 1525.-C troponin is 60.  Procalcitonin 6.08.  Lactic acid elevated at 2.6 and on repeat was 3.0.  CT abdomen/pelvis showed acute cholecystitis with perforation, 3 cm liver abscess. Patient was seen by general surgery, has cholecystostomy performed by IR.  Antibiotic continued on Zosyn.  Culture from cholecystostomy showed gram-negative rods.  Blood cultures so far no growth.    Principal Problem:   Severe sepsis with acute organ dysfunction (HCC) Active Problems:   Acute cholecystitis   CVA (cerebral vascular accident) (HCC)   Hypokalemia   CAD S/P percutaneous coronary angioplasty   Essential hypertension   Hyperlipidemia   Elevated LFTs   Elevated CK   Cholecystitis with perforation of gallbladder   Liver abscess   Hypophosphatemia   Overweight (BMI 25.0-29.9)   Assessment and Plan:  Severe sepsis with acute organ dysfunction (HCC) Acute acute cholecystitis with cholelithiasis with a perforated gallbladder. Liver abscess secondary to perforated gallbladder. Community acquired pneumonia ruled out. Patient met sepsis criteria with leukocytosis, tachypnea and occasional heart rate over 90.  Lactic acidosis of 3.0.  This is due to acute cholecystitis with perforation. Patient has been  seen by general surgery, IR has performed cholecystostomy, culture from drain grew Klebsiella pneumoniae, blood cultures no growth for 2 days.  Blood culture no growth for 2 days. I personally reviewed patient chest x-ray and CT scan results, chest x-ray showed bilateral lower lobe atelectasis, but CT scan of abdomen/pelvis did not show any significant abnormality in the lower lobes.  As a result, pneumonia has been ruled out. Bile culture came back with Klebsiella, susceptibilities still pending.  Continue Zosyn. Patient condition appears to be greatly improved, currently surgery has advance diet to soft.  Patient will be ready for discharge in next 24 hours after final culture results available.  However, patient will need nursing placement per PT/OT. Patient also has 3.4 cm liver abscess from perforated gallbladder.  This needed followed with general surgery as outpatient.   Acute rhabdomyolysis, nontraumatic. Patient received IV fluids, CK level has dropped down.   Acute hypoxic respiratory failure after fluids. Acute on chronic combined systolic and diastolic congestive heart failure. Patient developed cough and some short of breath, hypoxia after fluids.  IV fluid rate was decreased.  Recent echocardiac cardiogram performed in 02/12/2023 showed ejection fraction 45 to 50% with grade 1 diastolic dysfunction. Patient still on 2 L oxygen, but no signal short of breath.  Patient had an increased oxygen requirement with shortness of breath, give a dose of IV Lasix. Wean off oxygen.   Severe hypokalemia. Hypophosphatemia. Condition resolved.   Essential hypertension Continue as needed blood pressure medicine.   CAD S/P percutaneous coronary angioplasty Status post DES PCI 2014 Patient is currently on Plavix, which has not been continued on admission   Urinary retention secondary to benign  prostate hypertrophy. Gross hematuria.  Unrelated to Plavix. CT scan also showed bladder outlet  obstruction.  Foley catheter was anchored, gross hematuria improving.  Urine culture showed multiple species, not consistent with UTI.  Start Flomax.      Subjective:  Patient has some shortness of breath today, increased oxygen requirement.  No abdominal pain.  Physical Exam: Vitals:   03/11/23 2352 03/12/23 0400 03/12/23 0731 03/12/23 1220  BP: (!) 148/75 (!) 142/64 (!) 144/75 114/78  Pulse: 74 69 71 68  Resp:   16 16  Temp: 99.2 F (37.3 C) 98 F (36.7 C) 98.4 F (36.9 C) 98 F (36.7 C)  TempSrc:  Oral Oral Oral  SpO2: (!) 88% 93% 93% 97%  Weight:      Height:       General exam: Appears calm and comfortable  Respiratory system: Crackles in the base bilaterally. Respiratory effort normal. Cardiovascular system: S1 & S2 heard, RRR. No JVD, murmurs, rubs, gallops or clicks. No pedal edema. Gastrointestinal system: Abdomen is nondistended, soft and nontender. No organomegaly or masses felt. Normal bowel sounds heard. Central nervous system: Alert and oriented. No focal neurological deficits. Extremities: Symmetric 5 x 5 power. Skin: No rashes, lesions or ulcers Psychiatry:  Mood & affect appropriate.    Data Reviewed:  Lab results reviewed.  Family Communication: Nephew updated over the phone.  Disposition: Status is: Inpatient Remains inpatient appropriate because: Severity of disease, IV treatment.  Unsafe discharge.     Time spent: 35 minutes  Author: Marrion Coy, MD 03/12/2023 2:41 PM  For on call review [email or web address].

## 2023-03-12 NOTE — Progress Notes (Signed)
[facility] SURGICAL PROGRESS NOTE (cpt [id number])  Hospital Day(s): 3.   Interval History: Patient seen and examined, no acute events or new complaints overnight. Patient reports he is feeling well. No abdominal pain, nausea, emesis. Renal function normal; sCr - 0.48; UO - 1075 ccs. Hypokalemia resolved; now 3.47. He is s/p percutaneous cholecystostomy tube placement on 02/08. Cx from this with klebsiella. Output measured at 125 ccs in last 24 hours; this is bilious. He is on zosyn. Soft diet; tolerating.   Review of Systems:  Constitutional: denies fever, chills  HEENT: denies cough or congestion  Respiratory: denies any shortness of breath  Cardiovascular: denies chest pain or palpitations  Gastrointestinal: denies abdominal pain, N/V Genitourinary: denies burning with urination or urinary frequency Musculoskeletal: denies pain, decreased motor or sensation  Vital signs in last 24 hours: [min-max] current  Temp:  [98 F (36.7 C)-99.2 F (37.3 C)] 98 F (36.7 C) (02/11 0400) Pulse Rate:  [25-84] 69 (02/11 0400) Resp:  [15-19] 18 (02/10 1300) BP: (122-161)/(62-82) 142/64 (02/11 0400) SpO2:  [88 %-96 %] 93 % (02/11 0400)     Height: 6' (182.9 cm) Weight: 95.3 kg     Intake/Output last 2 shifts:  02/10 0701 - 02/11 0700 In: -  Out: 1200 [Urine:1075; Drains:125]   Physical Exam:  Constitutional: alert, cooperative and no distress  HENT: normocephalic without obvious abnormality  Eyes: PERRL, EOM's grossly intact and symmetric  Respiratory: breathing non-labored at rest  Cardiovascular: regular rate and sinus rhythm  Gastrointestinal: soft, non-tender, and non-distended, no rebound/guarding. Percutaneous cholecystostomy tube on RUQ; bilious   Labs:     Latest Ref Rng & Units 03/11/2023    6:11 AM 03/10/2023    4:19 AM 03/09/2023    2:14 PM  CBC  WBC 4.0 - 10.5 K/uL 12.5  11.2  14.7   Hemoglobin 13.0 - 17.0 g/dL 60.4  54.0  98.1   Hematocrit 39.0 - 52.0 % 39.2   33.2  32.6   Platelets 150 - 400 K/uL 93  89  97       Latest Ref Rng & Units 03/12/2023    3:30 AM 03/11/2023    6:11 AM 03/10/2023    4:19 AM  CMP  Glucose 70 - 99 mg/dL 90  191  478   BUN 8 - 23 mg/dL 12  18  23    Creatinine 0.61 - 1.24 mg/dL 2.95  6.21  3.08   Sodium 135 - 145 mmol/L 138  144  145   Potassium 3.5 - 5.1 mmol/L 3.7  3.1  2.5   Chloride 98 - 111 mmol/L 101  103  104   CO2 22 - 32 mmol/L 27  32  32   Calcium 8.9 - 10.3 mg/dL 7.4  7.9  7.9   Total Protein 6.5 - 8.1 g/dL  5.3  4.9   Total Bilirubin 0.0 - 1.2 mg/dL  0.7  0.8   Alkaline Phos 38 - 126 U/L  80  68   AST 15 - 41 U/L  36  40   ALT 0 - 44 U/L  33  33      Imaging studies: No new pertinent imaging studies   Assessment/Plan: (ICD-10's: K81.0) 83 y.o. male with acute perforated cholecystitis s/p percutaneous cholecystostomy tube on 02/08, complicated by pertinent comorbidities including recent CVA on ASA/Plavix   - Okay to continue soft/regular diet - Continue IV Abx (Zosyn); recommend total 14 days (IV+PO) at discharge; follow up Cx - klebsiella  -  Continue percutaneous cholecystostomy tube; monitor output. He will discharge with this for a minimum of 6-8 weeks. Flush daily with 5 ccs NS; script on paper chart   - Monitor abdominal examination   - Pain control prn; antiemetics prn - Further management per primary service    - Discharge Planning: Okay for discharge from surgical perspective, we will follow up in 3 weeks. Instructions updated. NS flush Rx on paper chart. Abx as above    All of the above findings and recommendations were discussed with the patient, and the medical team, and all of patient's questions were answered to his expressed satisfaction.  -- Lynden Oxford, PA-C [facility] 03/12/2023, 7:01 AM M-F: 7am - 4pm

## 2023-03-12 NOTE — Evaluation (Signed)
 Physical Therapy Evaluation Patient Details Name: [facility] MRN: [id number] DOB: 01/01/1941 Today's Date: 03/12/2023  History of Present Illness  Pt is an 83 y.o. male with acute perforated cholecystitis s/p percutaneous cholecystostomy tube on 02/08. Pt also with acute rhabdomyolysis, severe sepsis, acute hypoxic respiratory failure, CHF. PMH including recent CVA, HTN, CAD, BPH, bradycardia, dysphagia.  Clinical Impression  Pt is a pleasant 83 year old male who was admitted for severe sepsis with percutaneous cholecystomy tube placement on 03/09/23. Pt performs bed mobility with max assist and unable to perform transfers/ambulation this date due to cognition/safety. Pt demonstrates deficits with strength/cognition/mobility. Pt with agitation with attempts at questioning and mobility. Would benefit from skilled PT to address above deficits and promote optimal return to PLOF. Pt will continue to receive skilled PT services while admitted and will defer to TOC/care team for updates regarding disposition planning.       If plan is discharge home, recommend the following: Two people to help with walking and/or transfers;Two people to help with bathing/dressing/bathroom   Can travel by private vehicle   No    Equipment Recommendations  (TBD')  Recommendations for Other Services       Functional Status Assessment Patient has had a recent decline in their functional status and demonstrates the ability to make significant improvements in function in a reasonable and predictable amount of time.     Precautions / Restrictions Precautions Precautions: Fall Recall of Precautions/Restrictions: Intact Restrictions Weight Bearing Restrictions Per Provider Order: No      Mobility  Bed Mobility Overal bed mobility: Needs Assistance Bed Mobility: Supine to Sit     Supine to sit: Max assist     General bed mobility comments: needs assist for initiation of B LEs and then able to sit at EOB  briefly before returning back into bed secondary to agitation    Transfers                   General transfer comment: isn't able to tolerate at this time due to agitation    Ambulation/Gait                  Stairs            Wheelchair Mobility     Tilt Bed    Modified Rankin (Stroke Patients Only)       Balance Overall balance assessment: Needs assistance Sitting-balance support: Bilateral upper extremity supported, Feet supported Sitting balance-Leahy Scale: Fair                                       Pertinent Vitals/Pain Pain Assessment Pain Assessment: No/denies pain    Home Living Family/patient expects to be discharged to:: Private residence Living Arrangements: Alone                 Additional Comments: Pt is confused and poor historian. Per chart, pt was previously hospitalized for CVA, discharged to [facility] (?), now re-admitted from home. Unsure of home enviornment    Prior Function Prior Level of Function : Independent/Modified Independent;Driving             Mobility Comments: Community ambulator without AD prior to his CVA 1 month ago, reports since return home he was barely able to walk at all- per previous note. ADLs Comments: prior to his CVA ~1 month ago, pt was IND; He reports very  minimal activity when he returned home     Extremity/Trunk Assessment   Upper Extremity Assessment Upper Extremity Assessment: Defer to OT evaluation    Lower Extremity Assessment Lower Extremity Assessment: Generalized weakness (B LE grossly 3+/5; no apparent difference between R vs L)       Communication   Communication Communication: No apparent difficulties    Cognition Arousal: Alert Behavior During Therapy: Agitated   PT - Cognitive impairments: No family/caregiver present to determine baseline                       PT - Cognition Comments: pt alert to self and place. Becomes agitated when asked  questions Following commands: Intact       Cueing       General Comments      Exercises     Assessment/Plan    PT Assessment Patient needs continued PT services  PT Problem List Decreased strength;Decreased activity tolerance;Decreased balance;Decreased mobility       PT Treatment Interventions DME instruction;Therapeutic exercise;Gait training    PT Goals (Current goals can be found in the Care Plan section)  Acute Rehab PT Goals Patient Stated Goal: to go home PT Goal Formulation: With patient Time For Goal Achievement: 03/26/23 Potential to Achieve Goals: Good    Frequency Min 1X/week     Co-evaluation               AM-PAC PT "6 Clicks" Mobility  Outcome Measure Help needed turning from your back to your side while in a flat bed without using bedrails?: A Lot Help needed moving from lying on your back to sitting on the side of a flat bed without using bedrails?: A Lot Help needed moving to and from a bed to a chair (including a wheelchair)?: Total Help needed standing up from a chair using your arms (e.g., wheelchair or bedside chair)?: Total Help needed to walk in hospital room?: Total Help needed climbing 3-5 steps with a railing? : Total 6 Click Score: 8    End of Session Equipment Utilized During Treatment: Oxygen Activity Tolerance: Patient tolerated treatment well Patient left: in bed;with bed alarm set Nurse Communication: Mobility status PT Visit Diagnosis: Muscle weakness (generalized) (M62.81);Difficulty in walking, not elsewhere classified (R26.2);History of falling (Z91.81)    Time: 1610-9604 PT Time Calculation (min) (ACUTE ONLY): 29 min   Charges:   PT Evaluation $PT Eval Low Complexity: 1 Low PT Treatments $Therapeutic Activity: 8-22 mins PT General Charges $$ ACUTE PT VISIT: 1 Visit         Elizabeth Palau, PT, DPT, GCS [phone]   Wilena Tyndall 03/12/2023, 4:56 PM

## 2023-03-12 NOTE — Plan of Care (Signed)

## 2023-03-12 NOTE — Evaluation (Addendum)
Occupational Therapy Evaluation Patient Details Name: [facility] MRN: [id number] DOB: 24-Feb-1940 Today's Date: 03/12/2023   History of Present Illness   History of Present Illness: Pt is an 83 y.o. male with acute perforated cholecystitis s/p percutaneous cholecystostomy tube on 02/08. Pt also with acute rhabdomyolysis, severe sepsis, acute hypoxic respiratory failure, CHF. PMH including recent CVA, HTN, CAD, BPH, bradycardia, dysphagia.     Clinical Impressions Pt was seen for OT evaluation this date. Prior to hospital admission, pt was living at home alone following a recent CVA in January. He reports prior to Jan he was fully IND, driving, no AD use. However, since return home he was very limited and did not walk much at all.   Pt presents to acute OT demonstrating impaired ADL performance and functional mobility 2/2 weakness, low activity tolerance, balance deficits and cognitive deficits (See OT problem list for additional functional deficits). Pt currently requires CGA for rolling in bed. Noted to have a very large BM requiring a full linen change, peri-care and pt had put his hands in it. OT assisted with all tasks requiring total assist at bed level. He was agreeable to sit at EOB requiring min/mod A and cueing for initiation/sequencing at times. HR up to 128 and [facility] noted on tele for short period. Returned to [facility]. Sp02 96% with activity on 4L. STS attempts x2 from EOB with it elevated to RW requiring Max A with inability to reach full upright position d/t flexed knees/hips. Mod A to lateral scoot in sitting to reach [facility] for return to supine. He reports 8/10 pain to his cholecystomy tube site and catheter site.  Pt would benefit from skilled OT services to address noted impairments and functional limitations (see below for any additional details) in order to maximize safety and independence while minimizing falls risk and caregiver burden. Do anticipate the need for follow up OT services  upon acute hospital DC.      If plan is discharge home, recommend the following:   Two people to help with walking and/or transfers;A lot of help with bathing/dressing/bathroom;Assist for transportation;Help with stairs or ramp for entrance;Direct supervision/assist for financial management;Supervision due to cognitive status;Assistance with cooking/housework     Functional Status Assessment   Patient has had a recent decline in their functional status and demonstrates the ability to make significant improvements in function in a reasonable and predictable amount of time.     Equipment Recommendations   Other (comment) (defer)     Recommendations for Other Services         Precautions/Restrictions   Precautions Precautions: Fall Restrictions Weight Bearing Restrictions Per Provider Order: No     Mobility Bed Mobility Overal bed mobility: Needs Assistance Bed Mobility: Rolling, Sidelying to Sit, Sit to Sidelying Rolling: Contact guard assist, Supervision, Used rails Sidelying to sit: Min assist, Mod assist, Used rails, HOB elevated     Sit to sidelying: Mod assist, Used rails General bed mobility comments: able to roll to bil sides with cueing and use of bed rails with CGA/SUP; Min/Mod A for bed mobility tasks to reach sitting EOB    Transfers Overall transfer level: Needs assistance Equipment used: Rolling walker (2 wheels) Transfers: Sit to/from Stand Sit to Stand: Max assist, From elevated surface           General transfer comment: Max A for x2 STS attempts from EOB with inability to achieve full upright posture before needing to return to seated position; able to lateral scoot in  sitting with Min/Mod A      Balance Overall balance assessment: Needs assistance Sitting-balance support: Bilateral upper extremity supported, Feet supported Sitting balance-Leahy Scale: Fair     Standing balance support: Bilateral upper extremity supported, Reliant on  assistive device for balance Standing balance-Leahy Scale: Poor                             ADL either performed or assessed with clinical judgement   ADL Overall ADL's : Needs assistance/impaired                             Toileting- Clothing Manipulation and Hygiene: Total assistance;Bed level Toileting - Clothing Manipulation Details (indicate cue type and reason): large BM requiring full linen change and cleaning             Vision         Perception         Praxis         Pertinent Vitals/Pain Pain Assessment Pain Assessment: 0-10 Pain Score: 8  Pain Location: cholecystostomy tube site Pain Descriptors / Indicators: Sore, Tender Pain Intervention(s): Monitored during session, Repositioned     Extremity/Trunk Assessment Upper Extremity Assessment Upper Extremity Assessment: Right hand dominant;RUE deficits/detail;Generalized weakness RUE Deficits / Details: weakness remaining from stroke 2+/5   Lower Extremity Assessment Lower Extremity Assessment: Generalized weakness       Communication Communication Communication: No apparent difficulties   Cognition Arousal: Alert Behavior During Therapy: Impulsive Cognition: Cognition impaired   Orientation impairments: Time, Situation       Executive functioning impairment (select all impairments): Reasoning, Problem solving OT - Cognition Comments: oriented to name and place; when asked year or month pt states "I don't know, stop asking me questions"; noted to put hands in BM and pull on cathter tubing during session                 Following commands: Intact       Cueing  General Comments   Cueing Techniques: Verbal cues;Tactile cues;Visual cues      Exercises Other Exercises Other Exercises: Edu on role of OT in acute setting and importance of therapy to maximize safety/IND.   Shoulder Instructions      Home Living Family/patient expects to be discharged to::  Skilled nursing facility Living Arrangements: Alone                                      Prior Functioning/Environment Prior Level of Function : Independent/Modified Independent;Driving             Mobility Comments: Community ambulator without AD prior to his CVA 1 month ago, reports since return home he was barely able to walk at all ADLs Comments: prior to his CVA ~1 month ago, pt was IND; He reports very minimal activity when he returned home    OT Problem List: Decreased strength;Decreased activity tolerance;Impaired balance (sitting and/or standing);Decreased safety awareness;Decreased cognition;Pain   OT Treatment/Interventions: Self-care/ADL training;Therapeutic exercise;Patient/family education;Balance training;Therapeutic activities      OT Goals(Current goals can be found in the care plan section)   Acute Rehab OT Goals Patient Stated Goal: reduce pain OT Goal Formulation: With patient Time For Goal Achievement: 03/26/23 Potential to Achieve Goals: Fair ADL Goals Pt Will Perform Upper Body Bathing: with set-up;sitting  Pt Will Perform Lower Body Bathing: with min assist;sitting/lateral leans;sit to/from stand Pt Will Perform Lower Body Dressing: with min assist;sit to/from stand;sitting/lateral leans Pt Will Transfer to Toilet: with min assist;stand pivot transfer;bedside commode Pt Will Perform Toileting - Clothing Manipulation and hygiene: with min assist;sitting/lateral leans;sit to/from stand   OT Frequency:  Min 1X/week    Co-evaluation              AM-PAC OT "6 Clicks" Daily Activity     Outcome Measure Help from another person eating meals?: None Help from another person taking care of personal grooming?: A Little Help from another person toileting, which includes using toliet, bedpan, or urinal?: Total Help from another person bathing (including washing, rinsing, drying)?: A Lot Help from another person to put on and taking off  regular upper body clothing?: A Lot Help from another person to put on and taking off regular lower body clothing?: Total 6 Click Score: 13   End of Session Equipment Utilized During Treatment: Rolling walker (2 wheels);Gait belt Nurse Communication: Mobility status  Activity Tolerance: Patient limited by fatigue;Patient tolerated treatment well Patient left: in bed;with call bell/phone within reach;with bed alarm set  OT Visit Diagnosis: Other abnormalities of gait and mobility (R26.89);Muscle weakness (generalized) (M62.81);Unsteadiness on feet (R26.81)                Time: 1610-9604 OT Time Calculation (min): 35 min Charges:  OT General Charges $OT Visit: 1 Visit OT Evaluation $OT Eval Moderate Complexity: 1 Mod OT Treatments $Self Care/Home Management : 8-22 mins Nalee Lightle, OTR/L 03/12/23, 12:51 PM  Marin Milley E Jericha Bryden 03/12/2023, 12:44 PM

## 2023-03-12 NOTE — Progress Notes (Signed)
Referring Provider(s): Dr. Delton Prairie  Supervising Physician: Gilmer Mor  Patient Status:  [facility] - In-pt  Chief Complaint: Drain follow-up for perforated/gangrenous cholecystitis s/p percutaneous cholecystostomy   Brief History:  83 year old male with a history of CAD, DM, CVA, HLD, HTN, and sinus bradycardia who was found down at home by his nephew. Patient unable to remember what happened. He was brought to the [facility] ED and found to have acute cholecystitis w/ disruption of the GB wall and early intra-hepatic extension consistent with perforated/gangrenous cholecystitis. No definite stones on CT. IR consulted for percutaneous cholecystostomy tube which was subsequently placed on 03/09/23 by Dr. Vella Redhead.   Subjective:  Patient resting in bed on arrival to room. Minimally responsive to questions. NAD.  Allergies: Enalapril and Keflet [cephalexin]  Medications: Prior to Admission medications   Medication Sig Start Date End Date Taking? Authorizing Provider  amLODipine (NORVASC) 10 MG tablet Take 1 tablet (10 mg total) by mouth daily. 02/15/23  Yes Tyrone Nine, MD  aspirin EC 81 MG tablet Take 1 tablet (81 mg total) by mouth daily. Swallow whole. 02/15/23 05/16/23 Yes Tyrone Nine, MD  atorvastatin (LIPITOR) 40 MG tablet Take 1 tablet (40 mg total) by mouth daily. 02/15/23  Yes Tyrone Nine, MD  clopidogrel (PLAVIX) 75 MG tablet Take 1 tablet (75 mg total) by mouth daily. 02/15/23  Yes Tyrone Nine, MD  sodium chloride flush (NS) 0.9 % SOLN 5 mLs by Intracatheter route daily. Flush drain with 5 ccs of normal saline daily 03/12/23 04/23/23 Yes Donovan Kail, PA-C     Vital Signs: BP 114/78 (BP Location: Right Arm)   Pulse 68   Temp 98 F (36.7 C) (Oral)   Resp 16   Ht 6' (1.829 m)   Wt 210 lb 1.6 oz (95.3 kg)   SpO2 97%   BMI 28.49 kg/m   Physical Exam Vitals reviewed.  Constitutional:      Appearance: Normal appearance. He is normal weight.  HENT:     Head:  Normocephalic and atraumatic.     Mouth/Throat:     Mouth: Mucous membranes are moist.     Pharynx: Oropharynx is clear.  Cardiovascular:     Rate and Rhythm: Normal rate and regular rhythm.  Pulmonary:     Effort: Pulmonary effort is normal.  Abdominal:     General: Abdomen is flat.     Palpations: Abdomen is soft.     Tenderness: There is no abdominal tenderness.     Comments: RUQ perc chole drain to gravity w/ ~45cc bilious fluid in bag. Insertion site is non-erythematous. Overlying dressing is clean and dry. Drain flushes/aspirates appropriately  Musculoskeletal:     Cervical back: Normal range of motion.  Skin:    General: Skin is warm and dry.  Neurological:     Mental Status: Mental status is at baseline.     Drain Location: RUQ Size: Fr size: 10 Fr Date of placement: 03/09/23  Currently to: Drain collection device: gravity 24 hour output:  Output by Drain (mL) 03/10/23 0701 - 03/10/23 1900 03/10/23 1901 - 03/11/23 0700 03/11/23 0701 - 03/11/23 1900 03/11/23 1901 - 03/12/23 0700 03/12/23 0701 - 03/12/23 1237  Biliary Tube Cholecystostomy 10.2 Fr. RUQ  60  125     Interval imaging/drain manipulation:  none  Current examination: Flushes/aspirates easily.  Insertion site unremarkable. Suture and stat lock in place. Dressed appropriately.    Labs:  CBC: Recent Labs  02/11/23 1340 02/11/23 1347 03/09/23 1414 03/10/23 0419 03/11/23 0611  WBC 8.0  --  14.7* 11.2* 12.5*  HGB 16.4 16.3 10.5* 11.6* 13.1  HCT 48.2 48.0 32.6* 33.2* 39.2  PLT 129*  --  97* 89* 93*    COAGS: Recent Labs    02/11/23 1340 03/10/23 0419  INR 1.1 1.3*  APTT 30  --     BMP: Recent Labs    03/09/23 1414 03/10/23 0419 03/11/23 0611 03/12/23 0330  NA 142 145 144 138  K 2.2* 2.5* 3.1* 3.7  CL 106 104 103 101  CO2 25 32 32 27  GLUCOSE 150* 113* 108* 90  BUN 31* 23 18 12   CALCIUM 6.9* 7.9* 7.9* 7.4*  CREATININE 0.98 0.68 0.60* 0.48*  GFRNONAA >60 >60 >60 >60    LIVER  FUNCTION TESTS: Recent Labs    02/11/23 1340 03/09/23 1517 03/10/23 0419 03/11/23 0611  BILITOT 0.8 1.9* 0.8 0.7  AST 21 75* 40 36  ALT 17 53* 33 33  ALKPHOS 104 110 68 80  PROT 6.8 7.0 4.9* 5.3*  ALBUMIN 3.9 3.1* 2.0* 2.1*    Assessment and Plan:  83 year old male with a history of CAD, DM, CVA, HLD, HTN, and sinus bradycardia who was found down at home by his nephew. Patient unable to remember what happened. He was brought to the [facility] ED and found to have acute cholecystitis w/ disruption of the GB wall and early intra-hepatic extension consistent with perforated/gangrenous cholecystitis. No definite stones on CT. IR consulted for percutaneous cholecystostomy tube which was subsequently placed on 03/09/23 by Dr. Vella Redhead.    Plan: Continue TID flushes with 5 cc NS. Record output Q shift. Dressing changes QD or PRN if soiled.  Call IR APP or on call IR MD if difficulty flushing or sudden change in drain output.  Repeat imaging/possible drain injection once output < 10 mL/QD (excluding flush material). Consideration for drain removal if output is < 10 mL/QD (excluding flush material), pending discussion with the providing surgical service.  Discharge planning: Please contact IR APP or on call IR MD prior to patient d/c to ensure appropriate follow up plans are in place. Typically patient will follow up with IR clinic 10-14 days post d/c for repeat imaging/possible drain injection. IR scheduler will contact patient with date/time of appointment. Patient will need to flush drain QD with 5 cc NS, record output QD, dressing changes every 2-3 days or earlier if soiled.   IR will continue to follow - please call with questions or concerns.   Electronically Signed: Jama Flavors, PA-C 03/12/2023, 12:27 PM    I spent a total of 15 Minutes at the the patient's bedside AND on the patient's hospital floor or unit, greater than 50% of which was counseling/coordinating care for drain  follow up.

## 2023-03-12 NOTE — Progress Notes (Signed)
Transition of Care [facility]) - Inpatient Brief Assessment   Patient Details  Name: Francisco Reid MRN: [id number] Date of Birth: February 04, 1940  Transition of Care [facility]) CM/SW Contact:    Truddie Hidden, RN Phone Number: 03/12/2023, 10:32 AM   Clinical Narrative: TOC continuing to follow patient's progress throughout discharge planning.   Transition of Care Asessment: Insurance and Status: Insurance coverage has been reviewed Patient has primary care physician: Yes   Prior level of function:: Independent Prior/Current Home Services: No current home services Social Drivers of Health Review: SDOH reviewed no interventions necessary Readmission risk has been reviewed: Yes Transition of care needs: no transition of care needs at this time

## 2023-03-13 DIAGNOSIS — A419 Sepsis, unspecified organism: Secondary | ICD-10-CM | POA: Diagnosis not present

## 2023-03-13 DIAGNOSIS — R6520 Severe sepsis without septic shock: Secondary | ICD-10-CM | POA: Diagnosis not present

## 2023-03-13 LAB — CBC
HCT: 35.8 % — ABNORMAL LOW (ref 39.0–52.0)
Hemoglobin: 12.4 g/dL — ABNORMAL LOW (ref 13.0–17.0)
MCH: 29.5 pg (ref 26.0–34.0)
MCHC: 34.6 g/dL (ref 30.0–36.0)
MCV: 85 fL (ref 80.0–100.0)
Platelets: 134 10*3/uL — ABNORMAL LOW (ref 150–400)
RBC: 4.21 MIL/uL — ABNORMAL LOW (ref 4.22–5.81)
RDW: 13.2 % (ref 11.5–15.5)
WBC: 9.5 10*3/uL (ref 4.0–10.5)
nRBC: 0 % (ref 0.0–0.2)

## 2023-03-13 LAB — MAGNESIUM: Magnesium: 1.8 mg/dL (ref 1.7–2.4)

## 2023-03-13 LAB — BASIC METABOLIC PANEL
Anion gap: 8 (ref 5–15)
BUN: 15 mg/dL (ref 8–23)
CO2: 29 mmol/L (ref 22–32)
Calcium: 7.2 mg/dL — ABNORMAL LOW (ref 8.9–10.3)
Chloride: 101 mmol/L (ref 98–111)
Creatinine, Ser: 0.63 mg/dL (ref 0.61–1.24)
GFR, Estimated: >60 mL/min (ref >60)
Glucose, Bld: 118 mg/dL — ABNORMAL HIGH (ref 70–99)
Potassium: 2.7 mmol/L — CL (ref 3.5–5.1)
Sodium: 138 mmol/L (ref 135–145)

## 2023-03-13 MED ORDER — FUROSEMIDE 10 MG/ML IJ SOLN
40.0000 mg | Freq: Once | INTRAMUSCULAR | Status: AC
  Administered 2023-03-13: 40 mg via INTRAVENOUS
  Filled 2023-03-13: qty 4

## 2023-03-13 MED ORDER — ENOXAPARIN SODIUM 40 MG/0.4ML IJ SOSY
40.0000 mg | PREFILLED_SYRINGE | INTRAMUSCULAR | Status: DC
  Administered 2023-03-16 – 2023-03-17 (×2): 40 mg via SUBCUTANEOUS
  Filled 2023-03-13 (×5): qty 0.4

## 2023-03-13 MED ORDER — POTASSIUM CHLORIDE 10 MEQ/100ML IV SOLN
10.0000 meq | INTRAVENOUS | Status: AC
Start: 2023-03-13 — End: 2023-03-13
  Administered 2023-03-13 (×4): 10 meq via INTRAVENOUS
  Filled 2023-03-13 (×4): qty 100

## 2023-03-13 MED ORDER — POTASSIUM CHLORIDE 20 MEQ PO PACK
40.0000 meq | PACK | Freq: Once | ORAL | Status: AC
  Administered 2023-03-13: 40 meq via ORAL
  Filled 2023-03-13: qty 2

## 2023-03-13 NOTE — Discharge Instructions (Addendum)
Drain instructions: Please flush drain with 5 ml of NS once daily Please record drain output twice daily. Change drain dressing with dry gauze dressing once every 2-3 days or sooner if gets soiled.

## 2023-03-13 NOTE — Progress Notes (Signed)
Occupational Therapy Treatment Patient Details Name: Francisco Reid MRN: [id number] DOB: 25-Jun-1940 Today's Date: 03/13/2023   History of present illness Pt is an 83 y.o. male with acute perforated cholecystitis s/p percutaneous cholecystostomy tube on 02/08. Pt also with acute rhabdomyolysis, severe sepsis, acute hypoxic respiratory failure, CHF. PMH including recent CVA, HTN, CAD, BPH, bradycardia, dysphagia.   OT comments  Pt is supine in bed on arrival. Remains confused, but is agreeable to PT/OT co-tx session. He did not mention pain during session, just that his back was itching. Pt performed bed mobility with Mod A x2 for cueing, initiation and memory. Attempted STS and SPT to recliner multiples trials before successful d/t short term memory deficits and pt forgetting what he was getting up for needing max cueing and guidance to get to recliner safely. Min A x2 for STS from EOB x3 trials and Mod A x2 to safely transfer to recliner via SPT with no AD use. PT provided max A to don socks at bed level. Pt becomes agitated at times d/t his confusion and STM deficits. He was left in recliner with all needs in place and will cont to require skilled acute OT services to maximize his safety and IND to return to PLOF.       If plan is discharge home, recommend the following:  Two people to help with walking and/or transfers;A lot of help with bathing/dressing/bathroom;Assist for transportation;Help with stairs or ramp for entrance;Direct supervision/assist for financial management;Supervision due to cognitive status;Assistance with cooking/housework   Equipment Recommendations  Other (comment) (defer)    Recommendations for Other Services      Precautions / Restrictions Precautions Precautions: Fall Recall of Precautions/Restrictions: Intact Restrictions Weight Bearing Restrictions Per Provider Order: No       Mobility Bed Mobility Overal bed mobility: Needs Assistance Bed Mobility:  Supine to Sit   Sidelying to sit: Mod assist, Used rails, HOB elevated, +2 for physical assistance, +2 for safety/equipment (more d/t cognition)       General bed mobility comments: initation assist and trouble with following directions d/t STM needs increased cueing and time with Mod A x2 to reach EOB    Transfers Overall transfer level: Needs assistance Equipment used: Rolling walker (2 wheels) Transfers: Sit to/from Stand, Bed to chair/wheelchair/BSC Sit to Stand: From elevated surface, Min assist, +2 physical assistance Stand pivot transfers: Mod assist, +2 safety/equipment         General transfer comment: Min A x2 for mult STS trials from EOB to RW d/t pt unable to remember that we were trying to transfer to the recliner once in standing and would return to sitting; Mod A x2 for cognition to safely get to recliner without use of AD     Balance Overall balance assessment: Needs assistance Sitting-balance support: Bilateral upper extremity supported, Feet supported Sitting balance-Leahy Scale: Fair     Standing balance support: Bilateral upper extremity supported, Reliant on assistive device for balance Standing balance-Leahy Scale: Poor Standing balance comment: Min/mod A x2 for standing balance and cognition                           ADL either performed or assessed with clinical judgement   ADL Overall ADL's : Needs assistance/impaired  General ADL Comments: Max A to don socks at bed level by PT prior to OT entry    Extremity/Trunk Assessment              Vision       Perception     Praxis     Communication Communication Communication: No apparent difficulties   Cognition Arousal: Alert Behavior During Therapy: Impulsive Cognition: Cognition impaired   Orientation impairments: Time, Situation       Executive functioning impairment (select all impairments): Reasoning, Problem  solving OT - Cognition Comments: STM deficits, forgets what he is doing and becomes agitated                 Following commands: Impaired Following commands impaired: Follows one step commands inconsistently (STM impaired-forgets what he is doing and gets agitated)      Cueing   Cueing Techniques: Verbal cues, Tactile cues, Visual cues  Exercises      Shoulder Instructions       General Comments      Pertinent Vitals/ Pain       Pain Assessment Pain Assessment: Faces Faces Pain Scale: No hurt Pain Intervention(s): Monitored during session, Repositioned  Home Living                                          Prior Functioning/Environment              Frequency  Min 1X/week        Progress Toward Goals  OT Goals(current goals can now be found in the care plan section)  Progress towards OT goals: Progressing toward goals  Acute Rehab OT Goals Patient Stated Goal: go to rehab OT Goal Formulation: With patient/family Time For Goal Achievement: 03/26/23 Potential to Achieve Goals: Fair  Plan      Co-evaluation                 AM-PAC OT "6 Clicks" Daily Activity     Outcome Measure   Help from another person eating meals?: A Little Help from another person taking care of personal grooming?: A Little Help from another person toileting, which includes using toliet, bedpan, or urinal?: Total Help from another person bathing (including washing, rinsing, drying)?: A Lot Help from another person to put on and taking off regular upper body clothing?: A Lot Help from another person to put on and taking off regular lower body clothing?: Total 6 Click Score: 12    End of Session Equipment Utilized During Treatment: Rolling walker (2 wheels);Gait belt  OT Visit Diagnosis: Other abnormalities of gait and mobility (R26.89);Muscle weakness (generalized) (M62.81);Unsteadiness on feet (R26.81)   Activity Tolerance Patient tolerated  treatment well   Patient Left with chair alarm set;in chair;with call bell/phone within reach;with family/visitor present   Nurse Communication Mobility status        Time: 9811-9147 OT Time Calculation (min): 17 min  Charges: OT General Charges $OT Visit: 1 Visit OT Treatments $Therapeutic Activity: 8-22 mins  Xavior Niazi, OTR/L  03/13/23, 4:38 PM   Constance Goltz 03/13/2023, 4:34 PM

## 2023-03-13 NOTE — Progress Notes (Signed)
Physical Therapy Treatment Patient Details Name: Francisco Reid MRN: [id number] DOB: 1940/02/11 Today's Date: 03/13/2023   History of Present Illness Pt is an 83 y.o. male with acute perforated cholecystitis s/p percutaneous cholecystostomy tube on 02/08. Pt also with acute rhabdomyolysis, severe sepsis, acute hypoxic respiratory failure, CHF. PMH including recent CVA, HTN, CAD, BPH, bradycardia, dysphagia.    PT Comments  Pt is making gradual progress towards goals and continues to be limited by cognition. PT/OT co treat for safety this date. Pt able to transfer with +2 assist bed->chair. Poor sequencing/coordination and recall of task. Pt becomes agitation with over stimulation and multiple questions. Recommend short simple phrases. Will continue to progress as able.    If plan is discharge home, recommend the following: Two people to help with walking and/or transfers;Two people to help with bathing/dressing/bathroom   Can travel by private vehicle     No  Equipment Recommendations   (TBD)    Recommendations for Other Services       Precautions / Restrictions Precautions Precautions: Fall Recall of Precautions/Restrictions: Intact Restrictions Weight Bearing Restrictions Per Provider Order: No     Mobility  Bed Mobility Overal bed mobility: Needs Assistance Bed Mobility: Supine to Sit   Sidelying to sit: Mod assist, Used rails, HOB elevated, +2 for physical assistance, +2 for safety/equipment       General bed mobility comments: is able to initiate activity, however poor STM loss and frequently forgets task. Needs +2 for safety    Transfers Overall transfer level: Needs assistance Equipment used: Rolling walker (2 wheels) Transfers: Sit to/from Stand, Bed to chair/wheelchair/BSC Sit to Stand: Min assist, +2 physical assistance Stand pivot transfers: Mod assist, +2 safety/equipment         General transfer comment: 2 trials with RW, however due to safety,  performed 3rd attempt with no AD and performed SPT to recliner. Pt very fearful and has poor attention to task. Unable to coordinate and follow commands    Ambulation/Gait               General Gait Details: not safe to attempt   Stairs             Wheelchair Mobility     Tilt Bed    Modified Rankin (Stroke Patients Only)       Balance Overall balance assessment: Needs assistance Sitting-balance support: Bilateral upper extremity supported, Feet supported Sitting balance-Leahy Scale: Fair     Standing balance support: Bilateral upper extremity supported, Reliant on assistive device for balance Standing balance-Leahy Scale: Poor                              Communication Communication Communication: No apparent difficulties  Cognition Arousal: Alert Behavior During Therapy: Impulsive   PT - Cognitive impairments: History of cognitive impairments                       PT - Cognition Comments: more pleasant today, however still gets agitated when he realizes he is confused Following commands: Impaired Following commands impaired: Follows one step commands inconsistently    Cueing Cueing Techniques: Verbal cues, Tactile cues, Visual cues  Exercises      General Comments        Pertinent Vitals/Pain Pain Assessment Pain Assessment: No/denies pain    Home Living  Prior Function            PT Goals (current goals can now be found in the care plan section) Acute Rehab PT Goals Patient Stated Goal: to go home PT Goal Formulation: With patient Time For Goal Achievement: 03/26/23 Potential to Achieve Goals: Good Progress towards PT goals: Progressing toward goals    Frequency    Min 1X/week      PT Plan      Co-evaluation PT/OT/SLP Co-Evaluation/Treatment: Yes Reason for Co-Treatment: To address functional/ADL transfers;For patient/therapist safety PT goals addressed during  session: Mobility/safety with mobility OT goals addressed during session: ADL's and self-care      AM-PAC PT "6 Clicks" Mobility   Outcome Measure  Help needed turning from your back to your side while in a flat bed without using bedrails?: A Lot Help needed moving from lying on your back to sitting on the side of a flat bed without using bedrails?: A Lot Help needed moving to and from a bed to a chair (including a wheelchair)?: A Lot Help needed standing up from a chair using your arms (e.g., wheelchair or bedside chair)?: A Lot Help needed to walk in hospital room?: Total Help needed climbing 3-5 steps with a railing? : Total 6 Click Score: 10    End of Session Equipment Utilized During Treatment: Oxygen Activity Tolerance: Patient tolerated treatment well Patient left: in chair;with chair alarm set Nurse Communication: Mobility status PT Visit Diagnosis: Muscle weakness (generalized) (M62.81);Difficulty in walking, not elsewhere classified (R26.2);History of falling (Z91.81)     Time: 1610-9604 PT Time Calculation (min) (ACUTE ONLY): 24 min  Charges:    $Therapeutic Activity: 8-22 mins PT General Charges $$ ACUTE PT VISIT: 1 Visit                     Elizabeth Palau, PT, DPT, GCS [phone]    Teila Skalsky 03/13/2023, 4:51 PM

## 2023-03-13 NOTE — Care Management Important Message (Signed)
Important Message  Patient Details  Name: Francisco Reid MRN: [id number] Date of Birth: Mar 20, 1940   Important Message Given:  Yes - Medicare IM     Sherilyn Banker 03/13/2023, 10:03 AM

## 2023-03-13 NOTE — TOC Progression Note (Addendum)
Transition of Care [facility]) - Progression Note    Patient Details  Name: Francisco Reid MRN: [id number] Date of Birth: 1940-10-16  Transition of Care [facility]) CM/SW Contact  Truddie Hidden, RN Phone Number: 03/13/2023, 10:04 AM  Clinical Narrative:    Sherron Monday with patient's Francisco Reid regarding discharge plan for SNF. Patient Francisco Reid is agreeable to SNF and would like facilities near [place]. Francisco Reid stated patient was previously at [facility]. He does not wish for patient to discharge to [facility].   Attempt to speak with the admissions coordinator for [facility], [facility], @ [phone]. No answer. Left a message. Obtained fax for referrals, [phone].   10:31am FL2 completed with PASSR Bed search started for area facilities and facilities near [place].  4:25pm Spoke with patient's Francisco Reid. He would like referral sent to [facility]. Referral sent.             Expected Discharge Plan and Services                                               Social Determinants of Health (SDOH) Interventions SDOH Screenings   Food Insecurity: No Food Insecurity (03/11/2023)  Housing: Low Risk  (03/11/2023)  Transportation Needs: No Transportation Needs (03/11/2023)  Utilities: Not At Risk (03/11/2023)  Social Connections: Patient Declined (03/11/2023)  Tobacco Use: Medium Risk (03/11/2023)    Readmission Risk Interventions     No data to display

## 2023-03-13 NOTE — Progress Notes (Signed)
PROGRESS NOTE    Francisco Reid  VWU:[id number] DOB: [birth year] DOA: 03/09/2023 PCP: Patient, No Pcp Per  244A/244A-AA  LOS: 4 days   Brief hospital course:   Assessment & Plan: Francisco Reid is an 83 year old male with history of recent left lacunar infarct, history of CAD status post DES in 2014 currently on Plavix, chronic sinus bradycardia, history of dysphagia status post dilatation per [facility], gastritis, hypercholesterolemia, hypertension, thyroid nodule, who presents to the emergency department for chief concerns of abdominal pain, fever, altered mental status. Patient has been too weak and unable to ambulate for EMS.   CT abdomen/pelvis showed acute cholecystitis with perforation, 3 cm liver abscess. Patient was seen by general surgery, has cholecystostomy performed by IR.    Severe sepsis with acute organ dysfunction (HCC) Acute acute cholecystitis with cholelithiasis with a perforated gallbladder. Patient met sepsis criteria with leukocytosis, tachypnea and occasional heart rate over 90.  Lactic acidosis of 3.0.  This is due to acute cholecystitis with perforation. Patient has been seen by general surgery, IR has performed cholecystostomy, culture from drain grew Klebsiella pneumoniae. --pt started on Zosyn and transitioned to Unasyn. --cont Unasyn --cont drain management  Liver abscess secondary to perforated gallbladder. Patient also has 3.4 cm liver abscess from perforated gallbladder.  This needed followed with general surgery as outpatient.   Acute rhabdomyolysis, nontraumatic. Patient received IV fluids, CK level has dropped down.   Acute hypoxic respiratory failure after fluids. Acute on chronic combined systolic and diastolic congestive heart failure. Patient developed cough and some short of breath, hypoxia after fluids.  IV fluid rate was decreased.  Recent echocardiac cardiogram performed in 02/12/2023 showed ejection fraction 45 to 50% with grade 1 diastolic  dysfunction. --repeat IV lasix 40 x1 today --Continue supplemental O2 to keep sats >=92%, wean as tolerated  Severe hypokalemia. Hypophosphatemia. --monitor and supplement PRN   Essential hypertension Continue as needed blood pressure medicine.   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 Gross hematuria.  Unrelated to Plavix. CT scan also showed bladder outlet obstruction.  Foley catheter was anchored, gross hematuria improving.  Urine culture showed multiple species, not consistent with UTI.  Started Flomax. --cont Flomax  Community acquired pneumonia ruled out.    DVT prophylaxis: Lovenox SQ Code Status: Full code  Family Communication:  Level of care: Progressive Dispo:   The patient is from: home Anticipated d/c is to: SNF rehab Anticipated d/c date is: to be determined   Subjective and Interval History:  Pt reported no pain, no dyspnea.   Objective: Vitals:   03/12/23 2356 03/13/23 0343 03/13/23 1206 03/13/23 1633  BP: (!) 154/70 139/70 (!) 142/58 (!) 147/77  Pulse: 70 72 74 90  Resp: 16 18    Temp: 98.2 F (36.8 C)  98.1 F (36.7 C) 97.6 F (36.4 C)  TempSrc:      SpO2: 96% 92% 95% 98%  Weight:      Height:        Intake/Output Summary (Last 24 hours) at 03/13/2023 1747 Last data filed at 03/13/2023 1600 Gross per 24 hour  Intake 333.79 ml  Output 4425 ml  Net -4091.21 ml   Filed Weights   03/09/23 2053  Weight: 95.3 kg    Examination:   Constitutional: NAD, alert, oriented to self, hospital and president HEENT: conjunctivae and lids normal, EOMI  CV: No cyanosis.   RESP: normal respiratory effort Abdomen: drain present with large amount of brown liquid output Extremities: No effusions, edema in BLE SKIN: warm, dry Neuro: II - XII grossly intact.   Psych: Normal mood and affect.      Data Reviewed: I have personally reviewed labs and imaging studies  Time spent: 50 minutes  Darlin Priestly, MD [facility] If 7PM-7AM, please contact night-coverage 03/13/2023, 5:47 PM

## 2023-03-13 NOTE — [place] FL2 (Signed)
[place] MEDICAID FL2 LEVEL OF CARE FORM     IDENTIFICATION  Patient Name: Francisco Reid Birthdate: 1940-06-20 Sex: male Admission Date (Current Location): 03/09/2023  [facility] and [place] Number:  Chiropodist and Address:  [facility], [address], [place], [place] [address]      Provider Number: [id number]  Attending Physician Name and Address:  Darlin Priestly, MD  Relative Name and Phone Number:  Sena, [facility])  [phone] [facility])    Current Level of Care: Hospital Recommended Level of Care: Skilled Nursing Facility Prior Approval Number:    Date Approved/Denied:   PASRR Number: [id number]  Discharge Plan:      Current Diagnoses: Patient Active Problem List   Diagnosis Date Noted   Overweight (BMI 25.0-29.9) 03/12/2023   Hypophosphatemia 03/11/2023   Liver abscess 03/10/2023   Severe sepsis with acute organ dysfunction (HCC) 03/09/2023   Hypokalemia 03/09/2023   Acute cholecystitis 03/09/2023   CAD S/P percutaneous coronary angioplasty 03/09/2023   Essential hypertension 03/09/2023   Hyperlipidemia 03/09/2023   Elevated LFTs 03/09/2023   Elevated CK 03/09/2023   Cholecystitis with perforation of gallbladder 03/09/2023   CVA (cerebral vascular accident) (HCC) 02/11/2023   Acute CVA (cerebrovascular accident) (HCC) 02/11/2023   Angioedema 06/25/2015    Orientation RESPIRATION BLADDER Height & Weight     Self  Normal External catheter, Incontinent Weight: 95.3 kg Height:  6' (182.9 cm)  BEHAVIORAL SYMPTOMS/MOOD NEUROLOGICAL BOWEL NUTRITION STATUS  Other (Comment) (n/a)  (n/a) Incontinent (biliary tube) Diet (Soft)  AMBULATORY STATUS COMMUNICATION OF NEEDS Skin   Limited Assist Verbally Normal                       Personal Care Assistance Level of Assistance  Bathing, Dressing Bathing Assistance: Limited assistance   Dressing Assistance: Limited assistance     Functional Limitations Info    Sight Info:  Adequate Hearing Info: Adequate      SPECIAL CARE FACTORS FREQUENCY  PT (By licensed PT), OT (By licensed OT)     PT Frequency: Min 2x weekly OT Frequency: Min 2x weekly            Contractures Contractures Info: Not present    Additional Factors Info  Code Status, Allergies Code Status Info: FULL Allergies Info: Enalapril, Keflet (Cephalexin)           Current Medications (03/13/2023):  This is the current hospital active medication list Current Facility-Administered Medications  Medication Dose Route Frequency Provider Last Rate Last Admin   acetaminophen (TYLENOL) tablet 650 mg  650 mg Oral Q6H PRN Cox, Amy N, DO       Or   acetaminophen (TYLENOL) suppository 650 mg  650 mg Rectal Q6H PRN Cox, Amy N, DO       Ampicillin-Sulbactam (UNASYN) 3 g in sodium chloride 0.9 % 100 mL IVPB  3 g Intravenous Q6H Marrion Coy, MD 200 mL/hr at 03/13/23 0632 3 g at 03/13/23 8469   Chlorhexidine Gluconate Cloth 2 % PADS 6 each  6 each Topical Q0600 Barrie Folk, [facility]   6 each at 03/13/23 6295   hydrALAZINE (APRESOLINE) injection 5 mg  5 mg Intravenous Q6H PRN Cox, Amy N, DO       ondansetron (ZOFRAN) tablet 4 mg  4 mg Oral Q6H PRN Cox, Amy N, DO       Or   ondansetron (ZOFRAN) injection 4 mg  4 mg Intravenous Q6H PRN Cox, Amy N,  DO   4 mg at 03/11/23 1147   potassium chloride 10 mEq in 100 mL IVPB  10 mEq Intravenous Q1 Hr x 4 Jawo, Modou L, NP 100 mL/hr at 03/13/23 0955 10 mEq at 03/13/23 0955   sodium chloride flush (NS) 0.9 % injection 5 mL  5 mL Intracatheter Q8H Cox, Amy N, DO   5 mL at 03/13/23 5366   tamsulosin (FLOMAX) capsule 0.4 mg  0.4 mg Oral Daily Marrion Coy, MD   0.4 mg at 03/13/23 4403     Discharge Medications: Please see discharge summary for a list of discharge medications.  Relevant Imaging Results:  Relevant Lab Results:   Additional Information SSN: [id number]  Truddie Hidden, RN

## 2023-03-14 DIAGNOSIS — A419 Sepsis, unspecified organism: Secondary | ICD-10-CM | POA: Diagnosis not present

## 2023-03-14 DIAGNOSIS — R6520 Severe sepsis without septic shock: Secondary | ICD-10-CM | POA: Diagnosis not present

## 2023-03-14 LAB — BASIC METABOLIC PANEL
Anion gap: 10 (ref 5–15)
BUN: 16 mg/dL (ref 8–23)
CO2: 26 mmol/L (ref 22–32)
Calcium: 7.6 mg/dL — ABNORMAL LOW (ref 8.9–10.3)
Chloride: 104 mmol/L (ref 98–111)
Creatinine, Ser: 0.62 mg/dL (ref 0.61–1.24)
GFR, Estimated: >60 mL/min (ref >60)
Glucose, Bld: 108 mg/dL — ABNORMAL HIGH (ref 70–99)
Potassium: 3.4 mmol/L — ABNORMAL LOW (ref 3.5–5.1)
Sodium: 140 mmol/L (ref 135–145)

## 2023-03-14 LAB — CBC
HCT: 37.9 % — ABNORMAL LOW (ref 39.0–52.0)
Hemoglobin: 13.1 g/dL (ref 13.0–17.0)
MCH: 29.4 pg (ref 26.0–34.0)
MCHC: 34.6 g/dL (ref 30.0–36.0)
MCV: 85.2 fL (ref 80.0–100.0)
Platelets: 157 10*3/uL (ref 150–400)
RBC: 4.45 MIL/uL (ref 4.22–5.81)
RDW: 13.1 % (ref 11.5–15.5)
WBC: 9 10*3/uL (ref 4.0–10.5)
nRBC: 0 % (ref 0.0–0.2)

## 2023-03-14 LAB — CULTURE, BLOOD (ROUTINE X 2)
Culture: NO GROWTH
Culture: NO GROWTH
Special Requests: ADEQUATE

## 2023-03-14 LAB — MAGNESIUM: Magnesium: 2 mg/dL (ref 1.7–2.4)

## 2023-03-14 MED ORDER — POTASSIUM CHLORIDE 20 MEQ PO PACK
40.0000 meq | PACK | Freq: Once | ORAL | Status: AC
  Administered 2023-03-14: 40 meq via ORAL
  Filled 2023-03-14: qty 2

## 2023-03-14 MED ORDER — FUROSEMIDE 10 MG/ML IJ SOLN
40.0000 mg | Freq: Once | INTRAMUSCULAR | Status: AC
Start: 2023-03-14 — End: 2023-03-14
  Administered 2023-03-14: 40 mg via INTRAVENOUS
  Filled 2023-03-14: qty 4

## 2023-03-14 NOTE — Plan of Care (Signed)
Problem: Fluid Volume: Goal: Hemodynamic stability will improve Outcome: Progressing   Problem: Respiratory: Goal: Ability to maintain adequate ventilation will improve Outcome: Progressing   Problem: Safety: Goal: Ability to remain free from injury will improve Outcome: Progressing   Problem: Skin Integrity: Goal: Risk for impaired skin integrity will decrease Outcome: Progressing

## 2023-03-14 NOTE — Progress Notes (Signed)
Referring Provider(s): Dr. Delton Prairie  Supervising Physician: Ruel Favors  Patient Status:  [facility] - In-pt  Chief Complaint: Drain follow-up for perforated/gangrenous cholecystitis s/p percutaneous cholecystostomy   Brief History:  83 year old male with a history of CAD, DM, CVA, HLD, HTN, and sinus bradycardia who was found down at home by his nephew. Patient unable to remember what happened. He was brought to the [facility] ED and found to have acute cholecystitis w/ disruption of the GB wall and early intra-hepatic extension consistent with perforated/gangrenous cholecystitis. No definite stones on CT. IR consulted for percutaneous cholecystostomy tube which was subsequently placed on 03/09/23 by Dr. Vella Redhead.   Subjective:  Patient sitting up in bed eating breakfast on arrival to room. Denies any pain surrounding the drain site.  Allergies: Enalapril and Keflet [cephalexin]  Medications: Prior to Admission medications   Medication Sig Start Date End Date Taking? Authorizing Provider  amLODipine (NORVASC) 10 MG tablet Take 1 tablet (10 mg total) by mouth daily. 02/15/23  Yes Tyrone Nine, MD  aspirin EC 81 MG tablet Take 1 tablet (81 mg total) by mouth daily. Swallow whole. 02/15/23 05/16/23 Yes Tyrone Nine, MD  atorvastatin (LIPITOR) 40 MG tablet Take 1 tablet (40 mg total) by mouth daily. 02/15/23  Yes Tyrone Nine, MD  clopidogrel (PLAVIX) 75 MG tablet Take 1 tablet (75 mg total) by mouth daily. 02/15/23  Yes Tyrone Nine, MD  sodium chloride flush (NS) 0.9 % SOLN 5 mLs by Intracatheter route daily. Flush drain with 5 ccs of normal saline daily 03/12/23 04/23/23 Yes Donovan Kail, PA-C     Vital Signs: BP (!) 120/52 (BP Location: Left Arm)   Pulse 83   Temp 97.8 F (36.6 C)   Resp 18   Ht 6' (1.829 m)   Wt 210 lb 1.6 oz (95.3 kg)   SpO2 96%   BMI 28.49 kg/m   Physical Exam Vitals reviewed.  Constitutional:      Appearance: Normal appearance. He is normal  weight.  HENT:     Head: Normocephalic and atraumatic.     Mouth/Throat:     Mouth: Mucous membranes are moist.     Pharynx: Oropharynx is clear.  Cardiovascular:     Rate and Rhythm: Normal rate and regular rhythm.  Pulmonary:     Effort: Pulmonary effort is normal.  Abdominal:     General: Abdomen is flat.     Palpations: Abdomen is soft.     Tenderness: There is no abdominal tenderness.     Comments: RUQ perc chole drain to gravity w/ <40cc bilious fluid in bag. Insertion site is non-erythematous. Overlying dressing is clean and dry. Drain flushes/aspirates appropriately  Musculoskeletal:     Cervical back: Normal range of motion.  Skin:    General: Skin is warm and dry.  Neurological:     Mental Status: Mental status is at baseline.      Drain Location: RUQ Size: Fr size: 10 Fr Date of placement: 03/09/23  Currently to: Drain collection device: gravity 24 hour output:  Output by Drain (mL) 03/12/23 0701 - 03/12/23 1900 03/12/23 1901 - 03/13/23 0700 03/13/23 0701 - 03/13/23 1900 03/13/23 1901 - 03/14/23 0700 03/14/23 0701 - 03/14/23 1019  Biliary Tube Cholecystostomy 10.2 Fr. RUQ  50  80     Interval imaging/drain manipulation:  none  Current examination: Flushes/aspirates easily.  Insertion site unremarkable. Suture and stat lock in place. Dressed appropriately.    Labs:  CBC: Recent Labs    03/10/23 0419 03/11/23 0611 03/13/23 0355 03/14/23 0512  WBC 11.2* 12.5* 9.5 9.0  HGB 11.6* 13.1 12.4* 13.1  HCT 33.2* 39.2 35.8* 37.9*  PLT 89* 93* 134* 157    COAGS: Recent Labs    02/11/23 1340 03/10/23 0419  INR 1.1 1.3*  APTT 30  --     BMP: Recent Labs    03/11/23 0611 03/12/23 0330 03/13/23 0355 03/14/23 0512  NA 144 138 138 140  K 3.1* 3.7 2.7* 3.4*  CL 103 101 101 104  CO2 32 27 29 26   GLUCOSE 108* 90 118* 108*  BUN 18 12 15 16   CALCIUM 7.9* 7.4* 7.2* 7.6*  CREATININE 0.60* 0.48* 0.63 0.62  GFRNONAA >60 >60 >60 >60    LIVER FUNCTION  TESTS: Recent Labs    02/11/23 1340 03/09/23 1517 03/10/23 0419 03/11/23 0611  BILITOT 0.8 1.9* 0.8 0.7  AST 21 75* 40 36  ALT 17 53* 33 33  ALKPHOS 104 110 68 80  PROT 6.8 7.0 4.9* 5.3*  ALBUMIN 3.9 3.1* 2.0* 2.1*    Assessment and Plan:  83 year old male with a history of CAD, DM, CVA, HLD, HTN, and sinus bradycardia who was found down at home by his nephew. Patient unable to remember what happened. He was brought to the [facility] ED and found to have acute cholecystitis w/ disruption of the GB wall and early intra-hepatic extension consistent with perforated/gangrenous cholecystitis. No definite stones on CT. IR consulted for percutaneous cholecystostomy tube which was subsequently placed on 03/09/23 by Dr. Vella Redhead.    Plan: Continue TID flushes with 5 cc NS. Record output Q shift. Dressing changes QD or PRN if soiled.  Call IR APP or on call IR MD if difficulty flushing or sudden change in drain output.  Repeat imaging/possible drain injection once output < 10 mL/QD (excluding flush material). Consideration for drain removal if output is < 10 mL/QD (excluding flush material), pending discussion with the providing surgical service.  Discharge planning: Please contact IR APP or on call IR MD prior to patient d/c to ensure appropriate follow up plans are in place. Typically patient will follow up with IR clinic 10-14 days post d/c for repeat imaging/possible drain injection. IR scheduler will contact patient with date/time of appointment. Patient will need to flush drain QD with 5 cc NS, record output QD, dressing changes every 2-3 days or earlier if soiled.   IR will continue to follow - please call with questions or concerns.   Electronically Signed: Jama Flavors, PA-C 03/14/2023, 10:19 AM    I spent a total of 15 Minutes at the the patient's bedside AND on the patient's hospital floor or unit, greater than 50% of which was counseling/coordinating care for drain follow  up.

## 2023-03-14 NOTE — TOC Progression Note (Signed)
Transition of Care [facility]) - Progression Note    Patient Details  Name: Francisco Reid MRN: [id number] Date of Birth: August 16, 1940  Transition of Care [facility]) CM/SW Contact  Truddie Hidden, RN Phone Number: 03/14/2023, 3:23 PM  Clinical Narrative:    Spoke with patient's [name] regarding bed offers for [name], [name]. Family has chosen [name] and [address]. He has been advised authorization is required for patient to admit to SNF. [name] stated patient had used some of his SNF days. He was informed patient will be required to pay co-payment per medicare. He was advised to speak with facility regarding payment expectation.         Expected Discharge Plan and Services                                               Social Determinants of Health (SDOH) Interventions SDOH Screenings   Food Insecurity: No Food Insecurity (03/11/2023)  Housing: Low Risk  (03/11/2023)  Transportation Needs: No Transportation Needs (03/11/2023)  Utilities: Not At Risk (03/11/2023)  Social Connections: Patient Declined (03/11/2023)  Tobacco Use: Medium Risk (03/11/2023)    Readmission Risk Interventions     No data to display

## 2023-03-14 NOTE — Progress Notes (Signed)
PROGRESS NOTE    EVON LOPEZPEREZ  JYN:[id number] DOB: 16-Oct-1940 DOA: 03/09/2023 PCP: Patient, No Pcp Per  244A/244A-AA  LOS: 5 days   Brief hospital course:   Assessment & Plan: Mr. Herrick Hartog is an 83 year old male with history of recent left lacunar infarct, history of CAD status post DES in 2014 currently on Plavix, chronic sinus bradycardia, history of dysphagia status post dilatation per [facility] GI, gastritis, hypercholesterolemia, hypertension, thyroid nodule, who presents to the emergency department for chief concerns of abdominal pain, fever, altered mental status. Patient has been too weak and unable to ambulate for EMS.   CT abdomen/pelvis showed acute cholecystitis with perforation, 3 cm liver abscess. Patient was seen by general surgery, has cholecystostomy performed by IR.    Severe sepsis with acute organ dysfunction (HCC) Acute acute cholecystitis with cholelithiasis with a perforated gallbladder. Patient met sepsis criteria with leukocytosis, tachypnea and occasional heart rate over 90.  Lactic acidosis of 3.0.  This is due to acute cholecystitis with perforation. Patient has been seen by general surgery, IR has performed cholecystostomy, culture from drain grew Klebsiella pneumoniae. --pt started on Zosyn and transitioned to Unasyn. --cont Unasyn, for total 14 days --cont drain management  Liver abscess secondary to perforated gallbladder. Patient also has 3.4 cm liver abscess from perforated gallbladder.  Per GenSurg, this is likely in communication with the gallbladder disease, so the perc drain should be draining it too. --outpatient f/u with IR and GenSurg for repeat imaging   Acute rhabdomyolysis, nontraumatic. Patient received IV fluids, CK level has dropped down.   Acute hypoxic respiratory failure after fluids. Acute on chronic combined systolic and diastolic congestive heart failure. Patient developed cough and some short of breath, hypoxia after fluids.  IV  fluid rate was decreased.  Recent echocardiac cardiogram performed in 02/12/2023 showed ejection fraction 45 to 50% with grade 1 diastolic dysfunction. --weaned to RA today. --cont IV lasix 40 mg daily  Severe hypokalemia. Hypophosphatemia. --monitor and supplement PRN   Essential hypertension Continue as needed blood pressure medicine.   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 Gross hematuria.  Unrelated to Plavix. CT scan also showed bladder outlet obstruction.  Foley catheter was anchored, gross hematuria improving.  Urine culture showed multiple species, not consistent with UTI.  Started Flomax. --cont Flomax --voiding trial in about a week  Community acquired pneumonia ruled out.    DVT prophylaxis: Lovenox SQ Code Status: Full code  Family Communication:  Level of care: Progressive Dispo:   The patient is from: home Anticipated d/c is to: SNF rehab Anticipated d/c date is: 1-2 days   Subjective and Interval History:  No change today.   Objective: Vitals:   03/14/23 0100 03/14/23 0456 03/14/23 0745 03/14/23 1232  BP:  (!) 159/72 (!) 120/52 125/62  Pulse:  73 83 84  Resp:  18    Temp: 98 F (36.7 C) 98.4 F (36.9 C) 97.8 F (36.6 C) 98.4 F (36.9 C)  TempSrc:      SpO2:  99% 96% 90%  Weight:      Height:        Intake/Output Summary (Last 24 hours) at 03/14/2023 1739 Last data filed at 03/14/2023 1204 Gross per 24 hour  Intake 5 ml  Output 3180 ml  Net -3175 ml   Filed Weights   03/09/23 2053  Weight:  95.3 kg    Examination:   Constitutional: NAD CV: No cyanosis.   RESP: normal respiratory effort, on RA Extremities: No effusions, edema in BLE SKIN: warm, dry   Data Reviewed: I have personally reviewed labs and imaging studies  Time spent: 35 minutes  Darlin Priestly, MD [facility] If 7PM-7AM, please contact night-coverage 03/14/2023, 5:39 PM

## 2023-03-15 DIAGNOSIS — A419 Sepsis, unspecified organism: Secondary | ICD-10-CM | POA: Diagnosis not present

## 2023-03-15 DIAGNOSIS — R6520 Severe sepsis without septic shock: Secondary | ICD-10-CM | POA: Diagnosis not present

## 2023-03-15 LAB — AEROBIC/ANAEROBIC CULTURE W GRAM STAIN (SURGICAL/DEEP WOUND)
Gram Stain: NONE SEEN
Special Requests: NORMAL

## 2023-03-15 LAB — POTASSIUM: Potassium: 3.4 mmol/L — ABNORMAL LOW (ref 3.5–5.1)

## 2023-03-15 MED ORDER — POTASSIUM CHLORIDE 20 MEQ PO PACK
40.0000 meq | PACK | Freq: Every day | ORAL | Status: DC
  Administered 2023-03-15 – 2023-03-17 (×3): 40 meq via ORAL
  Filled 2023-03-15 (×3): qty 2

## 2023-03-15 MED ORDER — FUROSEMIDE 10 MG/ML IJ SOLN
40.0000 mg | Freq: Every day | INTRAMUSCULAR | Status: DC
  Administered 2023-03-15 – 2023-03-17 (×3): 40 mg via INTRAVENOUS
  Filled 2023-03-15 (×3): qty 4

## 2023-03-15 NOTE — Progress Notes (Signed)
 Physical Therapy Treatment Patient Details Name: Francisco Reid MRN: [id number] DOB: 1940-12-21 Today's Date: 03/15/2023   History of Present Illness Pt is an 83 y.o. male with acute perforated cholecystitis s/p percutaneous cholecystostomy tube on 02/08. Pt also with acute rhabdomyolysis, severe sepsis, acute hypoxic respiratory failure, CHF. PMH including recent CVA, HTN, CAD, BPH, bradycardia, dysphagia.    PT Comments  Pt was laying sideways in bed with BLE laying over bed rail upon arrival. He is alert and greets Thereasa Parkin however is only oriented x 2. Knew he was in hospital but unaware why or which hospital. Pt has short periods of agitation observed but was easily redirected. He was unaware he has had loose BM in bed. Author assisted pt with bed linen change and hygiene care in static standing at EOB. +1 assistance to stand EOB however due to multiple BM upon standing, elected not to progress away from away. Will need +2 for any ambulation attempts. Overall, pt is progressing. DC recs remain appropriate. He will continue to benefit form skilled PT at DC to maximize independence and safety with all ADLs,.       If plan is discharge home, recommend the following: A lot of help with walking and/or transfers;A lot of help with bathing/dressing/bathroom;Assistance with cooking/housework;Assistance with feeding;Direct supervision/assist for medications management;Direct supervision/assist for financial management;Assist for transportation;Help with stairs or ramp for entrance;Supervision due to cognitive status     Equipment Recommendations  Other (comment) (Defer to next level of care)       Precautions / Restrictions Precautions Precautions: Fall Recall of Precautions/Restrictions: Intact Restrictions Weight Bearing Restrictions Per Provider Order: No     Mobility  Bed Mobility Overal bed mobility: Needs Assistance Bed Mobility: Supine to Sit, Sit to Supine  Supine to sit: Min assist, Mod  assist Sit to supine: Min assist, Mod assist General bed mobility comments: pt's cognition impacts session more so than physical limtation.    Transfers Overall transfer level: Needs assistance Equipment used: Rolling walker (2 wheels) Transfers: Sit to/from Stand Sit to Stand: Min assist, Mod assist  General transfer comment: pt stood EOB 4 x with +1 min-mod assist. pt fatigues quickly with minimal standing tolerance. Stood long enough to be cleaned after having BM.    Ambulation/Gait  General Gait Details: limited session due to multiple loose BMs upon standing. Pt will require chair follow and +2 assistance for safe ambulation away form EOB.     Balance Overall balance assessment: Needs assistance Sitting-balance support: Bilateral upper extremity supported, Feet supported Sitting balance-Leahy Scale: Fair     Standing balance support: Bilateral upper extremity supported, During functional activity, Reliant on assistive device for balance Standing balance-Leahy Scale: Fair Standing balance comment: pt is still at high fall risk due to pt's cognition/impulsivity/and overall poor safety awareness       Communication Communication Communication: No apparent difficulties  Cognition Arousal: Alert Behavior During Therapy: Impulsive (at times)   PT - Cognitive impairments: History of cognitive impairments, No family/caregiver present to determine baseline   PT - Cognition Comments: Pt is alert and cooperative but does have short episodes of agitation at times. easily redirected Following commands: Impaired      Cueing Cueing Techniques: Verbal cues, Tactile cues, Visual cues         Pertinent Vitals/Pain Pain Assessment Pain Assessment: PAINAD Breathing: normal Negative Vocalization: none Facial Expression: smiling or inexpressive Body Language: relaxed Consolability: no need to console PAINAD Score: 0 Pain Descriptors / Indicators: Sore, Tender Pain  Intervention(s): Limited activity within patient's tolerance, Monitored during session, Premedicated before session, Repositioned     PT Goals (current goals can now be found in the care plan section) Acute Rehab PT Goals Patient Stated Goal: none stated Progress towards PT goals: Progressing toward goals    Frequency    Min 1X/week       Co-evaluation     PT goals addressed during session: Mobility/safety with mobility;Balance;Proper use of DME;Strengthening/ROM        AM-PAC PT "6 Clicks" Mobility   Outcome Measure  Help needed turning from your back to your side while in a flat bed without using bedrails?: A Lot Help needed moving from lying on your back to sitting on the side of a flat bed without using bedrails?: A Lot Help needed moving to and from a bed to a chair (including a wheelchair)?: A Lot Help needed standing up from a chair using your arms (e.g., wheelchair or bedside chair)?: A Lot Help needed to walk in hospital room?: Total Help needed climbing 3-5 steps with a railing? : Total 6 Click Score: 10    End of Session Equipment Utilized During Treatment: Oxygen Activity Tolerance: Patient tolerated treatment well Patient left: in bed;with call bell/phone within reach;with bed alarm set Nurse Communication: Mobility status PT Visit Diagnosis: Muscle weakness (generalized) (M62.81);Difficulty in walking, not elsewhere classified (R26.2);History of falling (Z91.81)     Time: 3244-0102 PT Time Calculation (min) (ACUTE ONLY): 14 min  Charges:    $Therapeutic Activity: 8-22 mins PT General Charges $$ ACUTE PT VISIT: 1 Visit                    Jetta Lout PTA 03/15/23, 3:17 PM

## 2023-03-15 NOTE — Progress Notes (Signed)
 Occupational Therapy Treatment Patient Details Name: Francisco Reid MRN: [id number] DOB: February 07, 1940 Today's Date: 03/15/2023   History of present illness Pt is an 83 y.o. male with acute perforated cholecystitis s/p percutaneous cholecystostomy tube on 02/08. Pt also with acute rhabdomyolysis, severe sepsis, acute hypoxic respiratory failure, CHF. PMH including recent CVA, HTN, CAD, BPH, bradycardia, dysphagia.   OT comments  Pt is supine in bed on arrival. Alert, but remains oriented to person and hospital only. Does not appear to be in pain. Initially cooperative, but cognition very limiting and unable to follow one step directions. Agitated with questions regarding orientation. Pt reached EOB with Max A and when asked to scoot forward, returns to supine with Min/CGA. Refused to get back up after that and rolled into sidelying with SUP using rails. Max A to reposition to [facility]. Pt left with all needs in place and will cont to require skilled acute OT services to maximize his safety and IND to return to PLOF.       If plan is discharge home, recommend the following:  Two people to help with walking and/or transfers;A lot of help with bathing/dressing/bathroom;Assist for transportation;Help with stairs or ramp for entrance;Direct supervision/assist for financial management;Supervision due to cognitive status;Assistance with cooking/housework   Equipment Recommendations  Other (comment) (defer)    Recommendations for Other Services      Precautions / Restrictions Precautions Precautions: Fall Recall of Precautions/Restrictions: Impaired Restrictions Weight Bearing Restrictions Per Provider Order: No       Mobility Bed Mobility Overal bed mobility: Needs Assistance Bed Mobility: Supine to Sit, Sit to Supine     Supine to sit: Max assist Sit to supine: Contact guard assist, Min assist   General bed mobility comments: cognition very limited during session today with agitation; Max A  to get to EOB then when asked to scoot pt returned to supine and refused to get back up-CGA/MIN to return to supine and Max A using chux pads with bed in trendelenburg to reposition to [facility]; left in sidelying    Transfers                   General transfer comment: deferred d/t cognition and inability to follow one step directions     Balance Overall balance assessment: Needs assistance Sitting-balance support: Bilateral upper extremity supported, Feet supported Sitting balance-Leahy Scale: Fair                                     ADL either performed or assessed with clinical judgement   ADL Overall ADL's : Needs assistance/impaired                                       General ADL Comments: agitated with questions and movement this afternoon, MAX A to sit at EOB d/t cognition and inability to follow directions    Extremity/Trunk Assessment              Vision       Perception     Praxis     Communication Communication Communication: No apparent difficulties   Cognition Arousal: Alert Behavior During Therapy: Impulsive (at times) Cognition: Cognition impaired   Orientation impairments: Time, Situation, Place         OT - Cognition Comments: STM deficits, forgets what he is doing  and becomes agitated                 Following commands: Impaired Following commands impaired: Follows one step commands inconsistently      Cueing   Cueing Techniques: Verbal cues, Tactile cues, Visual cues  Exercises      Shoulder Instructions       General Comments remains confused; only oriented to person and hospital-thought he was in [place]    Pertinent Vitals/ Pain       Pain Assessment Pain Assessment: Faces Faces Pain Scale: No hurt Pain Intervention(s): Monitored during session  Home Living                                          Prior Functioning/Environment              Frequency  Min  1X/week        Progress Toward Goals  OT Goals(current goals can now be found in the care plan section)  Progress towards OT goals: Progressing toward goals  Acute Rehab OT Goals Patient Stated Goal: get stronger/go to rehab OT Goal Formulation: With patient/family Time For Goal Achievement: 03/26/23 Potential to Achieve Goals: Fair  Plan      Co-evaluation        PT goals addressed during session: Mobility/safety with mobility;Balance;Proper use of DME;Strengthening/ROM        AM-PAC OT "6 Clicks" Daily Activity     Outcome Measure   Help from another person eating meals?: A Little Help from another person taking care of personal grooming?: A Little Help from another person toileting, which includes using toliet, bedpan, or urinal?: Total Help from another person bathing (including washing, rinsing, drying)?: A Lot Help from another person to put on and taking off regular upper body clothing?: A Lot Help from another person to put on and taking off regular lower body clothing?: Total 6 Click Score: 12    End of Session    OT Visit Diagnosis: Other abnormalities of gait and mobility (R26.89);Muscle weakness (generalized) (M62.81);Unsteadiness on feet (R26.81)   Activity Tolerance Treatment limited secondary to agitation   Patient Left in bed;with call bell/phone within reach;with bed alarm set   Nurse Communication Mobility status        Time: 1610-9604 OT Time Calculation (min): 12 min  Charges: OT General Charges $OT Visit: 1 Visit OT Treatments $Therapeutic Activity: 8-22 mins  Abdoulaye Drum, OTR/L  03/15/23, 4:09 PM   Jena Tegeler E Nicolo Tomko 03/15/2023, 4:07 PM

## 2023-03-15 NOTE — Plan of Care (Signed)

## 2023-03-15 NOTE — Progress Notes (Signed)
PROGRESS NOTE    OLGA SEYLER  ZOX:[id number] DOB: 1940-05-16 DOA: 03/09/2023 PCP: Patient, No Pcp Per  148A/148A-AA  LOS: 6 days   Brief hospital course:   Assessment & Plan: Mr. Drevion Offord is an 83 year old male with history of recent left lacunar infarct, history of CAD status post DES in 2014 currently on Plavix, chronic sinus bradycardia, history of dysphagia status post dilatation per [facility] GI, gastritis, hypercholesterolemia, hypertension, thyroid nodule, who presents to the emergency department for chief concerns of abdominal pain, fever, altered mental status. Patient has been too weak and unable to ambulate for EMS.   CT abdomen/pelvis showed acute cholecystitis with perforation, 3 cm liver abscess. Patient was seen by general surgery, has cholecystostomy performed by IR.    Severe sepsis with acute organ dysfunction (HCC) Acute acute cholecystitis with cholelithiasis with a perforated gallbladder. Patient met sepsis criteria with leukocytosis, tachypnea and occasional heart rate over 90.  Lactic acidosis of 3.0.  This is due to acute cholecystitis with perforation. Patient has been seen by general surgery, IR has performed cholecystostomy, culture from drain grew Klebsiella pneumoniae. --pt started on Zosyn and transitioned to Unasyn. Plan: --cont Unasyn, for total 14 days --cont drain management  Liver abscess secondary to perforated gallbladder. Patient also has 3.4 cm liver abscess from perforated gallbladder.  Per GenSurg, this is likely in communication with the gallbladder disease, so the perc drain should be draining it too. --outpatient f/u with IR and GenSurg for repeat imaging   Acute rhabdomyolysis, nontraumatic. Patient received IV fluids, CK level has dropped down.   Acute hypoxic respiratory failure after fluids. Acute on chronic combined systolic and diastolic congestive heart failure. Patient developed cough and some short of breath, hypoxia after  fluids.  IV fluid rate was decreased.  Recent echocardiac cardiogram performed in 02/12/2023 showed ejection fraction 45 to 50% with grade 1 diastolic dysfunction. --weaned to RA. Plan: --cont IV lasix 40 mg daily  Severe hypokalemia. --start potassium 40 mEq daily --monitor and supplement PRN  Hypophosphatemia. --monitor and supplement PRN   Essential hypertension Continue as needed blood pressure medicine.   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 Gross hematuria.  Unrelated to Plavix. CT scan also showed bladder outlet obstruction.  Foley catheter was anchored, gross hematuria improving.  Urine culture showed multiple species, not consistent with UTI.  Started Flomax. --cont Flomax --voiding trial in about a week  Community acquired pneumonia ruled out.    DVT prophylaxis: Lovenox SQ Code Status: Full code  Family Communication:  Level of care: Med-Surg Dispo:   The patient is from: home Anticipated d/c is to: SNF rehab Anticipated d/c date is: whenever bed available   Subjective and Interval History:  Pt denied dyspnea.  No complaint.   Objective: Vitals:   03/15/23 0125 03/15/23 0818 03/15/23 1249 03/15/23 1910  BP: 132/65 136/70 134/72 134/69  Pulse: 71 81 80 75  Resp:  17 17 16   Temp: 98.3 F (36.8 C) (!) 97.5 F (36.4 C) 98.8 F (37.1 C) 98.7 F (37.1 C)  TempSrc: Oral     SpO2: 93% 96% 93% 95%  Weight:      Height:        Intake/Output Summary (Last 24 hours) at 03/15/2023 1941 Last data filed at 03/15/2023 1626 Gross per 24 hour  Intake 400 ml  Output 2425 ml  Net -2025 ml   Filed Weights   03/09/23 2053  Weight: 95.3 kg    Examination:   Constitutional: NAD, alert, slow reactions HEENT: conjunctivae and lids normal, EOMI CV: No cyanosis.   RESP: normal respiratory effort,  Crumpler not in his nostrils   Abdomen: drain with significant output Neuro: II - XII grossly intact.   Foley present   Data Reviewed: I have personally reviewed labs and imaging studies  Time spent: 35 minutes  Darlin Priestly, MD [facility] If 7PM-7AM, please contact night-coverage 03/15/2023, 7:41 PM

## 2023-03-16 DIAGNOSIS — R6520 Severe sepsis without septic shock: Secondary | ICD-10-CM | POA: Diagnosis not present

## 2023-03-16 DIAGNOSIS — A419 Sepsis, unspecified organism: Secondary | ICD-10-CM | POA: Diagnosis not present

## 2023-03-16 LAB — MAGNESIUM: Magnesium: 2.1 mg/dL (ref 1.7–2.4)

## 2023-03-16 LAB — BASIC METABOLIC PANEL
Anion gap: 8 (ref 5–15)
BUN: 15 mg/dL (ref 8–23)
CO2: 24 mmol/L (ref 22–32)
Calcium: 8.2 mg/dL — ABNORMAL LOW (ref 8.9–10.3)
Chloride: 107 mmol/L (ref 98–111)
Creatinine, Ser: 0.62 mg/dL (ref 0.61–1.24)
GFR, Estimated: >60 mL/min (ref >60)
Glucose, Bld: 119 mg/dL — ABNORMAL HIGH (ref 70–99)
Potassium: 3.4 mmol/L — ABNORMAL LOW (ref 3.5–5.1)
Sodium: 139 mmol/L (ref 135–145)

## 2023-03-16 MED ORDER — POTASSIUM CHLORIDE CRYS ER 20 MEQ PO TBCR
40.0000 meq | EXTENDED_RELEASE_TABLET | Freq: Once | ORAL | Status: AC
  Administered 2023-03-16: 40 meq via ORAL
  Filled 2023-03-16: qty 2

## 2023-03-16 NOTE — Plan of Care (Signed)

## 2023-03-16 NOTE — Plan of Care (Signed)
   Problem: Fluid Volume: Goal: Hemodynamic stability will improve Outcome: Progressing   Problem: Clinical Measurements: Goal: Diagnostic test results will improve Outcome: Progressing Goal: Signs and symptoms of infection will decrease Outcome: Progressing   Problem: Respiratory: Goal: Ability to maintain adequate ventilation will improve Outcome: Progressing

## 2023-03-16 NOTE — Progress Notes (Signed)
 PROGRESS NOTE    Francisco Reid  ZOX:[id number] DOB: 1940/11/17 DOA: 03/09/2023 PCP: Patient, No Pcp Per  148A/148A-AA  LOS: 7 days   Brief hospital course:   Assessment & Plan: Francisco Reid is an 83 year old male with history of recent left lacunar infarct, history of CAD status post DES in 2014 currently on Plavix, chronic sinus bradycardia, history of dysphagia status post dilatation per [facility] GI, gastritis, hypercholesterolemia, hypertension, thyroid nodule, who presents to the emergency department for chief concerns of abdominal pain, fever, altered mental status. Patient has been too weak and unable to ambulate for EMS.   CT abdomen/pelvis showed acute cholecystitis with perforation, 3 cm liver abscess. Patient was seen by general surgery, has cholecystostomy performed by IR.    Severe sepsis with acute organ dysfunction (HCC) Acute acute cholecystitis with cholelithiasis with a perforated gallbladder. Patient met sepsis criteria with leukocytosis, tachypnea and occasional heart rate over 90.  Lactic acidosis of 3.0.  This is due to acute cholecystitis with perforation. Patient has been seen by general surgery, IR has performed cholecystostomy, culture from drain grew Klebsiella pneumoniae. --pt started on Zosyn and transitioned to Unasyn. Plan: --cont Unasyn, for total 14 days --cont drain management  Liver abscess secondary to perforated gallbladder. Patient also has 3.4 cm liver abscess from perforated gallbladder.  Per GenSurg, this is likely in communication with the gallbladder disease, so the perc drain should be draining it too. --outpatient f/u with IR and GenSurg for repeat imaging   Acute rhabdomyolysis, nontraumatic. Patient received IV fluids, CK level has dropped down.   Acute hypoxic respiratory failure after fluids. Acute on chronic combined systolic and diastolic congestive heart failure. Patient developed cough and some short of breath, hypoxia after  fluids.  IV fluid rate was decreased.  Recent echocardiac cardiogram performed in 02/12/2023 showed ejection fraction 45 to 50% with grade 1 diastolic dysfunction. --weaned to RA. Plan: --cont IV lasix 40 daily  Severe hypokalemia. --cont potassium 40 mEq daily --monitor and supplement PRN  Hypophosphatemia. --monitor and supplement PRN   Essential hypertension Continue as needed blood pressure medicine.   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 Gross hematuria.  Unrelated to Plavix. CT scan also showed bladder outlet obstruction.  Foley catheter was anchored, gross hematuria improving.  Urine culture showed multiple species, not consistent with UTI.  Started Flomax. --cont Flomax --voiding trial in about a week  Community acquired pneumonia ruled out.    DVT prophylaxis: Lovenox SQ Code Status: Full code  Family Communication:  Level of care: Med-Surg Dispo:   The patient is from: home Anticipated d/c is to: SNF rehab Anticipated d/c date is: whenever bed available   Subjective and Interval History:  Pt had no complaint.   Objective: Vitals:   03/15/23 1910 03/15/23 2255 03/16/23 0749 03/16/23 1519  BP: 134/69 (!) 143/63 139/80 115/62  Pulse: 75 66 88 73  Resp: 16 14 16 16   Temp: 98.7 F (37.1 C) 97.6 F (36.4 C) 98.7 F (37.1 C) 98.3 F (36.8 C)  TempSrc:   Oral Oral  SpO2: 95% 95% 95% 96%  Weight:      Height:        Intake/Output Summary (Last 24 hours) at 03/16/2023 1815 Last data filed at 03/16/2023 1500 Gross per 24 hour  Intake 120 ml  Output 1750 ml  Net -1630  ml   Filed Weights   03/09/23 2053  Weight: 95.3 kg    Examination:   Constitutional: NAD, awake, but seemed lethargic HEENT: conjunctivae and lids normal, EOMI CV: No cyanosis.   RESP: normal respiratory effort, on  RA Neuro: II - XII grossly intact.   Psych: subdued mood and affect.   Foley present   Data Reviewed: I have personally reviewed labs and imaging studies  Time spent: 35 minutes  Darlin Priestly, MD [facility] If 7PM-7AM, please contact night-coverage 03/16/2023, 6:15 PM

## 2023-03-17 DIAGNOSIS — A419 Sepsis, unspecified organism: Secondary | ICD-10-CM | POA: Diagnosis not present

## 2023-03-17 DIAGNOSIS — R6520 Severe sepsis without septic shock: Secondary | ICD-10-CM | POA: Diagnosis not present

## 2023-03-17 MED ORDER — POTASSIUM CHLORIDE CRYS ER 20 MEQ PO TBCR
40.0000 meq | EXTENDED_RELEASE_TABLET | Freq: Two times a day (BID) | ORAL | Status: DC
  Administered 2023-03-17 – 2023-03-19 (×4): 40 meq via ORAL
  Filled 2023-03-17 (×4): qty 2

## 2023-03-17 MED ORDER — POTASSIUM CHLORIDE 20 MEQ PO PACK: 40.0000 meq | PACK | Freq: Two times a day (BID) | ORAL | Status: DC

## 2023-03-17 NOTE — Progress Notes (Signed)
 PROGRESS NOTE    Francisco Reid  ZOX:[id number] DOB: [birth year] DOA: 03/09/2023 PCP: Patient, No Pcp Per  148A/148A-AA  LOS: 8 days   Brief hospital course:   Assessment & Plan: Francisco Reid is an 83 year old male with history of recent left lacunar infarct, history of CAD status post DES in 2014 currently on Plavix, chronic sinus bradycardia, history of dysphagia status post dilatation per [facility] GI, gastritis, hypercholesterolemia, hypertension, thyroid nodule, who presents to the emergency department for chief concerns of abdominal pain, fever, altered mental status. Patient has been too weak and unable to ambulate for EMS.   CT abdomen/pelvis showed acute cholecystitis with perforation, 3 cm liver abscess. Patient was seen by general surgery, has cholecystostomy performed by IR.    Severe sepsis with acute organ dysfunction (HCC) Acute acute cholecystitis with cholelithiasis with a perforated gallbladder. Patient met sepsis criteria with leukocytosis, tachypnea and occasional heart rate over 90.  Lactic acidosis of 3.0.  This is due to acute cholecystitis with perforation. Patient has been seen by general surgery, IR has performed cholecystostomy, culture from drain grew Klebsiella pneumoniae. --pt started on Zosyn and transitioned to Unasyn. Plan: --cont Unasyn, for total 14 days --cont drain management  Liver abscess secondary to perforated gallbladder. Patient also has 3.4 cm liver abscess from perforated gallbladder.  Per GenSurg, this is likely in communication with the gallbladder disease, so the perc drain should be draining it too. --outpatient f/u with IR and GenSurg for repeat imaging   Acute rhabdomyolysis, nontraumatic. Patient received IV fluids, CK level has dropped down.   Acute hypoxic respiratory failure after fluids. Acute on chronic combined systolic and diastolic congestive heart failure. Patient developed cough and some short of breath, hypoxia after  fluids.  IV fluid rate was decreased.  Recent echocardiac cardiogram performed in 02/12/2023 showed ejection fraction 45 to 50% with grade 1 diastolic dysfunction. --weaned to RA. Plan: --cont IV lasix 40 daily  Severe hypokalemia. --cont potassium 40 mEq daily --monitor and supplement PRN  Hypophosphatemia. --monitor and supplement PRN   Essential hypertension Continue as needed blood pressure medicine.   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 Gross hematuria.  Unrelated to Plavix. CT scan also showed bladder outlet obstruction.  Foley catheter was anchored, gross hematuria improving.  Urine culture showed multiple species, not consistent with UTI.  Started Flomax. --cont Flomax --voiding trial in about a week  Hx of stroke --can not use his right hand.  Need help with feeding.  Community acquired pneumonia ruled out.    DVT prophylaxis: Lovenox SQ Code Status: Full code  Family Communication: son updated at bedside today Level of care: Med-Surg Dispo:   The patient is from: home Anticipated d/c is to: SNF rehab Anticipated d/c date is: whenever bed available   Subjective and Interval History:  Pt had no complaint today.   Objective: Vitals:   03/16/23 1519 03/16/23 2117 03/17/23 0738 03/17/23 1451  BP: 115/62 (!) 123/56 120/83 128/61  Pulse: 73 74 75 70  Resp: 16 16 18 16   Temp: 98.3 F (36.8 C) 98 F (36.7 C) 97.8 F (36.6 C) 97.9 F (36.6 C)  TempSrc: Oral Oral Oral Oral  SpO2: 96% 93% 97% 97%  Weight:      Height:        Intake/Output Summary (Last 24 hours) at 03/17/2023 1803 Last  data filed at 03/17/2023 1500 Gross per 24 hour  Intake 465 ml  Output 1750 ml  Net -1285 ml   Filed Weights   03/09/23 2053  Weight: 95.3 kg    Examination:   Constitutional: NAD, alert,  oriented HEENT: conjunctivae and lids normal, EOMI CV: No cyanosis.   RESP: normal respiratory effort, on RA Neuro: II - XII grossly intact.   Foley present   Data Reviewed: I have personally reviewed labs and imaging studies  Time spent: 35 minutes  Darlin Priestly, MD [facility] If 7PM-7AM, please contact night-coverage 03/17/2023, 6:03 PM

## 2023-03-18 DIAGNOSIS — R6520 Severe sepsis without septic shock: Secondary | ICD-10-CM | POA: Diagnosis not present

## 2023-03-18 DIAGNOSIS — A419 Sepsis, unspecified organism: Secondary | ICD-10-CM | POA: Diagnosis not present

## 2023-03-18 LAB — POTASSIUM: Potassium: 3.4 mmol/L — ABNORMAL LOW (ref 3.5–5.1)

## 2023-03-18 MED ORDER — AMOXICILLIN-POT CLAVULANATE 875-125 MG PO TABS
1.0000 | ORAL_TABLET | Freq: Two times a day (BID) | ORAL | Status: DC
Start: 2023-03-18 — End: 2023-03-19
  Administered 2023-03-18 – 2023-03-19 (×3): 1 via ORAL
  Filled 2023-03-18 (×3): qty 1

## 2023-03-18 NOTE — TOC Progression Note (Signed)
 Transition of Care [facility]) - Progression Note    Patient Details  Name: Francisco Reid MRN: [id number] Date of Birth: 15-Aug-1940  [facility] of [facility]) CM/SW Contact  Marlowe Sax, RN Phone Number: 03/18/2023, 3:15 PM  Clinical Narrative:     Ins process started to go to [facility] re[facility]  pending.  Berkley Harvey ID# [id number]       Expected Discharge Plan and Services                                               Social Determinants of Health (SDOH) Interventions SDOH Screenings   Food Insecurity: No Food Insecurity (03/11/2023)  Housing: Low Risk  (03/11/2023)  Transportation Needs: No Transportation Needs (03/11/2023)  Utilities: Not At Risk (03/11/2023)  Social Connections: Patient Declined (03/11/2023)  Tobacco Use: Medium Risk (03/11/2023)    Readmission Risk Interventions     No data to display

## 2023-03-18 NOTE — Plan of Care (Signed)

## 2023-03-18 NOTE — Progress Notes (Signed)
 Physical Therapy Treatment Patient Details Name: Francisco Reid MRN: [id number] DOB: Apr 12, 1940 Today's Date: 03/18/2023   History of Present Illness Pt is an 83 y.o. male with acute perforated cholecystitis s/p percutaneous cholecystostomy tube on 02/08. Pt also with acute rhabdomyolysis, severe sepsis, acute hypoxic respiratory failure, CHF. PMH including recent CVA, HTN, CAD, BPH, bradycardia, dysphagia.    PT Comments  Pt was pleasantly confused but occasionally agitated during the session, most notably with mobility training, but easily redirected.   Pt required physical assist with all functional tasks in including for stability with standing activities.  Pt able to take several very small, effortful steps near the EOB with +2 HHA and max multi-modal cuing for proper sequencing.  Pt reported no adverse symptoms during the session with SpO2 and HR WNL on room air.  Pt will benefit from continued PT services upon discharge to safely address deficits listed in patient problem list for decreased caregiver assistance and eventual return to PLOF.      If plan is discharge home, recommend the following: A lot of help with walking and/or transfers;A lot of help with bathing/dressing/bathroom;Assistance with cooking/housework;Assistance with feeding;Direct supervision/assist for medications management;Direct supervision/assist for financial management;Assist for transportation;Help with stairs or ramp for entrance;Supervision due to cognitive status   Can travel by private vehicle     No  Equipment Recommendations  Other (comment) (TBD at next venue of care)    Recommendations for Other Services       Precautions / Restrictions Precautions Precautions: Fall Recall of Precautions/Restrictions: Impaired Restrictions Weight Bearing Restrictions Per Provider Order: No Other Position/Activity Restrictions: R-sided cholecystostomy tube     Mobility  Bed Mobility Overal bed mobility: Needs  Assistance Bed Mobility: Rolling, Sidelying to Sit, Sit to Sidelying Rolling: Mod assist Sidelying to sit: +2 for physical assistance, Mod assist     Sit to sidelying: +2 for physical assistance, Mod assist General bed mobility comments: Pt required assistance for BLE and trunk management with log roll traininig provided    Transfers Overall transfer level: Needs assistance Equipment used: 2 person hand held assist Transfers: Sit to/from Stand Sit to Stand: +2 physical assistance, Mod assist           General transfer comment: +2 mod A to come to standing and to maintain stability while in standing    Ambulation/Gait Ambulation/Gait assistance: Mod assist Gait Distance (Feet): 2 Feet Assistive device: 2 person hand held assist Gait Pattern/deviations: Step-to pattern, Decreased step length - right, Decreased step length - left, Leaning posteriorly Gait velocity: decreased     General Gait Details: Pt able to take a max of 3-4 very small, shuffling steps at the EOB with max multi-modal cues for sequencing   Stairs             Wheelchair Mobility     Tilt Bed    Modified Rankin (Stroke Patients Only)       Balance Overall balance assessment: Needs assistance Sitting-balance support: Bilateral upper extremity supported, Feet supported Sitting balance-Leahy Scale: Fair     Standing balance support: Bilateral upper extremity supported, During functional activity Standing balance-Leahy Scale: Poor                              Communication Communication Communication: No apparent difficulties  Cognition Arousal: Alert Behavior During Therapy: Impulsive, Agitated   PT - Cognitive impairments: History of cognitive impairments, No family/caregiver present to determine baseline  PT - Cognition Comments: Pt is alert and cooperative but does have short episodes of agitation with mobility but easily  redirected Following commands: Impaired Following commands impaired: Follows one step commands inconsistently    Cueing Cueing Techniques: Verbal cues, Tactile cues, Visual cues, Gestural cues  Exercises Total Joint Exercises Heel Slides: AAROM, Strengthening, Both, 10 reps, 15 reps Hip ABduction/ADduction: AAROM, Strengthening, Both, 10 reps, 15 reps Straight Leg Raises: AAROM, Strengthening, Both, 10 reps, 15 reps Other Exercises Other Exercises: Static sitting at the EOB x 5 min for improved core strength and activity tolerance    General Comments        Pertinent Vitals/Pain Pain Assessment Pain Assessment: No/denies pain    Home Living                          Prior Function            PT Goals (current goals can now be found in the care plan section) Progress towards PT goals: Progressing toward goals    Frequency    Min 1X/week      PT Plan      Co-evaluation              AM-PAC PT "6 Clicks" Mobility   Outcome Measure  Help needed turning from your back to your side while in a flat bed without using bedrails?: A Lot Help needed moving from lying on your back to sitting on the side of a flat bed without using bedrails?: A Lot Help needed moving to and from a bed to a chair (including a wheelchair)?: A Lot Help needed standing up from a chair using your arms (e.g., wheelchair or bedside chair)?: A Lot Help needed to walk in hospital room?: Total Help needed climbing 3-5 steps with a railing? : Total 6 Click Score: 10    End of Session Equipment Utilized During Treatment: Gait belt Activity Tolerance: Patient tolerated treatment well Patient left: in bed;with call bell/phone within reach;with bed alarm set;Other (comment) (Pt declined OOB to chair; fall mat in place) Nurse Communication: Mobility status PT Visit Diagnosis: Muscle weakness (generalized) (M62.81);Difficulty in walking, not elsewhere classified (R26.2);History of falling  (Z91.81)     Time: 6606-3016 PT Time Calculation (min) (ACUTE ONLY): 19 min  Charges:    $Therapeutic Exercise: 8-22 mins PT General Charges $$ ACUTE PT VISIT: 1 Visit                    D. Elly Modena PT, DPT 03/18/23, 4:08 PM

## 2023-03-18 NOTE — Progress Notes (Signed)
 PROGRESS NOTE    Francisco Reid  WJX:[id number] DOB: November 07, 1940 DOA: 03/09/2023 PCP: Patient, No Pcp Per  148A/148A-AA  LOS: 9 days   Brief hospital course:   Assessment & Plan: Francisco Reid is an 83 year old male with history of recent left lacunar infarct, history of CAD status post DES in 2014 currently on Plavix, chronic sinus bradycardia, history of dysphagia status post dilatation per [facility] GI, gastritis, hypercholesterolemia, hypertension, thyroid nodule, who presents to the emergency department for chief concerns of abdominal pain, fever, altered mental status. Patient has been too weak and unable to ambulate for EMS.   CT abdomen/pelvis showed acute cholecystitis with perforation, 3 cm liver abscess. Patient was seen by general surgery, has cholecystostomy performed by IR.    Severe sepsis with acute organ dysfunction (HCC) Acute acute cholecystitis with cholelithiasis with a perforated gallbladder. Patient met sepsis criteria with leukocytosis, tachypnea and occasional heart rate over 90.  Lactic acidosis of 3.0.  This is due to acute cholecystitis with perforation. Patient has been seen by general surgery, IR has performed cholecystostomy, culture from drain grew Klebsiella pneumoniae. --pt started on Zosyn and transitioned to Unasyn. Plan: --transition to Augmentin, for total 14 days --cont drain management  Liver abscess secondary to perforated gallbladder. Patient also has 3.4 cm liver abscess from perforated gallbladder.  Per GenSurg, this is likely in communication with the gallbladder disease, so the perc drain should be draining it too. --outpatient f/u with IR and GenSurg for repeat imaging   Acute rhabdomyolysis, nontraumatic. Patient received IV fluids, CK level has dropped down.   Acute hypoxic respiratory failure after fluids. Acute on chronic combined systolic and diastolic congestive heart failure. Patient developed cough and some short of breath, hypoxia  after fluids.  IV fluid rate was decreased.  Recent echocardiac cardiogram performed in 02/12/2023 showed ejection fraction 45 to 50% with grade 1 diastolic dysfunction. --weaned to RA. --started IV lasix 40 mg daily on 2/11 Plan: --d/c IV lasix today  Severe hypokalemia, persistent --increase potassium to 40 BID --monitor and supplement PRN  Hypophosphatemia. --monitor and supplement PRN   Essential hypertension Continue as needed blood pressure medicine.   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 Gross hematuria.  Unrelated to Plavix. CT scan also showed bladder outlet obstruction.  Foley catheter was anchored, gross hematuria improving.  Urine culture showed multiple species, not consistent with UTI.  Started Flomax. --cont Flomax --remove Foley today for voiding trial  Hx of stroke --can not use his right hand.  Need help with feeding.  Community acquired pneumonia ruled out.    DVT prophylaxis: Lovenox SQ Code Status: Full code  Family Communication: son updated at bedside today Level of care: Med-Surg Dispo:   The patient is from: home Anticipated d/c is to: SNF rehab Anticipated d/c date is: whenever bed available   Subjective and Interval History:  Pt confirmed he received help with feeding with each meal.  No complaints.   Objective: Vitals:   03/17/23 0738 03/17/23 1451 03/18/23 0735 03/18/23 1614  BP: 120/83 128/61 (!) 158/73 (!) 115/51  Pulse: 75 70 74 72  Resp: 18 16 17 16   Temp: 97.8 F (36.6 C) 97.9 F (36.6 C) 97.7 F (36.5 C) 98.5 F (36.9 C)  TempSrc: Oral Oral  Oral  SpO2: 97% 97% 95% 95%  Weight:  Height:        Intake/Output Summary (Last 24 hours) at 03/18/2023 1808 Last data filed at 03/18/2023 1700 Gross per 24 hour  Intake 1270 ml  Output 915 ml  Net 355 ml    Filed Weights   03/09/23 2053  Weight: 95.3 kg    Examination:   Constitutional: NAD, AAOx3 HEENT: conjunctivae and lids normal, EOMI CV: No cyanosis.   RESP: normal respiratory effort, on RA Abdomen: drain outputting dark brown/green fluids Neuro: II - XII grossly intact.   Psych: subdued mood and affect.     Data Reviewed: I have personally reviewed labs and imaging studies  Time spent: 35 minutes  Darlin Priestly, MD [facility] If 7PM-7AM, please contact night-coverage 03/18/2023, 6:08 PM

## 2023-03-19 DIAGNOSIS — A419 Sepsis, unspecified organism: Secondary | ICD-10-CM | POA: Diagnosis not present

## 2023-03-19 DIAGNOSIS — R6520 Severe sepsis without septic shock: Secondary | ICD-10-CM | POA: Diagnosis not present

## 2023-03-19 MED ORDER — TAMSULOSIN HCL 0.4 MG PO CAPS: 0.4000 mg | ORAL_CAPSULE | Freq: Every day | ORAL | Status: DC

## 2023-03-19 MED ORDER — AMOXICILLIN-POT CLAVULANATE 875-125 MG PO TABS: 1.0000 | ORAL_TABLET | Freq: Two times a day (BID) | ORAL | Status: AC

## 2023-03-19 MED ORDER — POTASSIUM CHLORIDE CRYS ER 20 MEQ PO TBCR: 40.0000 meq | EXTENDED_RELEASE_TABLET | Freq: Two times a day (BID) | ORAL | Status: AC

## 2023-03-19 NOTE — TOC Progression Note (Signed)
 Transition of Care [facility]) - Progression Note    Patient Details  Name: Francisco Reid MRN: [id number] Date of Birth: 03-08-1940  Transition [facility] Care [facility] CM/SW Contact  Marlowe Sax, RN Phone Number: 03/19/2023, 10:03 AM  Clinical Narrative:      Ins was approved to go to [place]      Expected Discharge Plan and Services                                               Social Determinants of Health (SDOH) Interventions SDOH Screenings   Food Insecurity: No Food Insecurity (03/11/2023)  Housing: Low Risk  (03/11/2023)  Transportation Needs: No Transportation Needs (03/11/2023)  Utilities: Not At Risk (03/11/2023)  Social Connections: Patient Declined (03/11/2023)  Tobacco Use: Medium Risk (03/11/2023)    Readmission Risk Interventions     No data to display

## 2023-03-19 NOTE — Progress Notes (Signed)
 Pt foley dc at 1400 2/17. Bladder scan at tonight showed 252 and notified Jawo NP. Orders to rescan in 4 hours and I&O if greater than 400. 4am bladder scan showed 542. Attempted to I&O and unable to advance and had to use a coude to retrieve 350. This is the 1st I&O since foley removed

## 2023-03-19 NOTE — Discharge Summary (Signed)
 Physician Discharge Summary   Francisco Reid  male DOB: 04/04/1940  XBM:[id number]  PCP: Patient, No Pcp Per  Admit date: 03/09/2023 Discharge date: 03/19/2023  Admitted From: home Disposition:  SNF rehab CODE STATUS: Full code  Discharge Instructions     Ambulatory referral to Urology   Complete by: As directed    Discharge wound care:   Complete by: As directed    flush drain QD with 5 cc NS, record output QD, dressing changes every 2-3 days or earlier if soiled.  follow up with IR clinic 10-14 days post d/c for repeat imaging/possible drain injection. IR scheduler will contact patient with date/time of appointment. [facility] Course:  For full details, please see H&P, progress notes, consult notes and ancillary notes.  Briefly,  Francisco Reid is an 83 year old male with history of recent stroke, history of CAD status post DES in 2014 currently on Plavix, chronic sinus bradycardia, history of dysphagia status post dilatation per [facility] GI, gastritis, hypertension, thyroid nodule, who presented to the emergency department for chief concerns of abdominal pain, fever, altered mental status. Patient has been too weak and unable to ambulate for EMS.    CT abdomen/pelvis showed acute cholecystitis with perforation, 3 cm liver abscess. Patient was seen by general surgery, has cholecystostomy performed by IR.     Severe sepsis with acute organ dysfunction (HCC) Acute acute cholecystitis with cholelithiasis with a perforated gallbladder. Patient met sepsis criteria with leukocytosis, tachypnea and occasional heart rate over 90.  Lactic acidosis of 3.0.  This is due to acute cholecystitis with perforation. Patient has been seen by general surgery, IR has performed cholecystostomy on 03/09/23, culture from drain grew Klebsiella pneumoniae. --pt was started on Zosyn and transitioned to Unasyn on 2/11.  Pt is discharged on 4 more days of Augmentin to finish a 14-day course. --cont  drain management and outpatient f/u as noted above in discharge wound care.   Liver abscess secondary to perforated gallbladder. Patient also has 3.4 cm liver abscess from perforated gallbladder.  Per GenSurg, this is likely in communication with the gallbladder disease, so the perc drain should be draining it too. --outpatient f/u with IR and GenSurg for repeat imaging   Acute rhabdomyolysis, nontraumatic. Patient received IV fluids, CK level has dropped down.   Acute hypoxic respiratory failure after fluids. Acute on chronic combined systolic and diastolic congestive heart failure. Patient developed cough and some shortness of breath, hypoxia after fluids.  Recent echocardiac cardiogram performed in 02/12/2023 showed ejection fraction 45 to 50% with grade 1 diastolic dysfunction. --started IV lasix 40 mg daily on 2/11, and continued until 2/17, with good results in urine output.  Pt weaned down to RA.   Severe hypokalemia, persistent --monitored and supplemented PRN --cont potassium 40 BID for 7 more days after discharge.   --SNF to check potassium level 1 week after discharge.   Hypophosphatemia. --monitored and supplemented PRN   Essential hypertension --amlodipine to be resumed after discharge.   CAD S/P percutaneous coronary angioplasty Status post DES PCI 2014 --on home ASA and plavix, to be resumed after discharge.   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 Gross hematuria.  Unrelated to Plavix. CT scan also showed bladder outlet obstruction.  Foley catheter was placed on 2/8, gross hematuria improving.  Urine culture showed multiple species, not consistent with UTI.  Started on Flomax. --Foley was removed on 2/17 for a voiding trial, however, pt still retained urine  and unable to void.  Foley replaced.  Pt is discharged with Foley cath and will need outpatient urology f/u.  Hx of recent stroke --can not use his right hand.  Need help with feeding.    Community acquired pneumonia ruled out.   Discharge Diagnoses:  Principal Problem:   Severe sepsis with acute organ dysfunction (HCC) Active Problems:   Acute cholecystitis   CVA (cerebral vascular accident) (HCC)   Hypokalemia   CAD S/P percutaneous coronary angioplasty   Essential hypertension   Hyperlipidemia   Elevated LFTs   Elevated CK   Cholecystitis with perforation of gallbladder   Liver abscess   Hypophosphatemia   Overweight (BMI 25.0-29.9)   30 Day Unplanned Readmission Risk Score    Flowsheet Row ED to Hosp-Admission (Current) from 03/09/2023 in [facility] (1A)  30 Day Unplanned Readmission Risk Score (%) 12.43 Filed at 03/19/2023 1200       This score is the patient's risk of an unplanned readmission within 30 days of being discharged (0 -100%). The score is based on dignosis, age, lab data, medications, orders, and past utilization.   Low:  0-14.9   Medium: 15-21.9   High: 22-29.9   Extreme: 30 and above         Discharge Instructions:  Allergies as of 03/19/2023       Reactions   Enalapril Swelling   Other Reaction(s): Angioedema   Keflet [cephalexin] Rash        Medication List     TAKE these medications    amLODipine 10 MG tablet Commonly known as: NORVASC Take 1 tablet (10 mg total) by mouth daily.   amoxicillin-clavulanate 875-125 MG tablet Commonly known as: AUGMENTIN Take 1 tablet by mouth every 12 (twelve) hours for 4 days.   aspirin EC 81 MG tablet Take 1 tablet (81 mg total) by mouth daily. Swallow whole.   atorvastatin 40 MG tablet Commonly known as: LIPITOR Take 1 tablet (40 mg total) by mouth daily.   clopidogrel 75 MG tablet Commonly known as: PLAVIX Take 1 tablet (75 mg total) by mouth daily.   potassium chloride SA 20 MEQ tablet Commonly known as: KLOR-CON M Take 2 tablets (40 mEq total) by mouth 2 (two) times daily for 7 days.   sodium chloride flush 0.9 % Soln Commonly known  as: NS 5 mLs by Intracatheter route daily. Flush drain with 5 ccs of normal saline daily   tamsulosin 0.4 MG Caps capsule Commonly known as: FLOMAX Take 1 capsule (0.4 mg total) by mouth daily. Start taking on: March 20, 2023               Discharge Care Instructions  (From admission, onward)           Start     Ordered   03/19/23 0000  Discharge wound care:       Comments: flush drain QD with 5 cc NS, record output QD, dressing changes every 2-3 days or earlier if soiled.  follow up with IR clinic 10-14 days post d/c for repeat imaging/possible drain injection. IR scheduler will contact patient with date/time of appointment. - -   03/19/23 1322             Follow-up Information     Piscoya, Elita Quick, MD. Schedule an appointment as soon as possible for a visit on 04/10/2023.   Specialty: General Surgery Why: hospital follow up; cholecystitis with perc chole tube AT 130 Contact information: [address]  [address] [place] [address] [phone]         [facility] Follow up.   Why: IR scheduler will call you to setup an appointment for exchange of your cholecystostomy tube 6-8 weeks after placement. Please call [facility] at [phone] with questions or concerns prior to your appointment. Contact information: [address] [place] [address]                Allergies  Allergen Reactions   Enalapril Swelling    Other Reaction(s): Angioedema   Keflet [Cephalexin] Rash     The results of significant diagnostics from this hospitalization (including imaging, microbiology, ancillary and laboratory) are listed below for reference.   Consultations:   Procedures/Studies: IR Perc Cholecystostomy Result Date: 03/10/2023 INDICATION: 83 year old male with acute gangrenous cholecystitis. He presents for cholecystostomy tube placement. EXAM: CHOLECYSTOSTOMY MEDICATIONS: Currently receiving intravenous  antibiotics. No additional prophylaxis was administered. ANESTHESIA/SEDATION: Moderate (conscious) sedation was employed during this procedure. A total of Versed 1 mg and Fentanyl 50 mcg was administered intravenously by the Radiology nurse. Moderate Sedation Time: 18 minutes. The patient's level of consciousness and vital signs were monitored continuously by radiology nursing throughout the procedure under my direct supervision. FLUOROSCOPY TIME:  Radiation exposure index: 11 mGy reference air kerma COMPLICATIONS: None immediate. PROCEDURE: Informed written consent was obtained from the patient after a thorough discussion of the procedural risks, benefits and alternatives. All questions were addressed. Maximal Sterile Barrier Technique was utilized including caps, mask, sterile gowns, sterile gloves, sterile drape, hand hygiene and skin antiseptic. A timeout was performed prior to the initiation of the procedure. The right upper quadrant was interrogated with ultrasound. The distended and inflamed gallbladder was successfully visualized. A suitable skin entry site was selected and marked. Local anesthesia was attained by infiltration with 1% lidocaine. A small dermatotomy was made. Under real-time ultrasound guidance, a 21 gauge needle was advanced into the gallbladder lumen the utilizing a trans peritoneal approach. The trans peritoneal approach was selected due to the patient being on dual antiplatelet therapy. The wire was coiled in the gallbladder lumen. The needle was removed. The transitional dilator was advanced over the wire and into the gallbladder lumen. The microwire was removed and exchanged for a 0.035 Amplatz wire. The percutaneous tract was dilated to 10 [place]. A 10 [place] all-purpose drain was then advanced over the wire and formed in the gallbladder. There was return of black bile and debris. A sample was reserved and sent to the lab for Gram stain and culture. Contrast was injected through the  tube opacifying the gallbladder lumen. Images were obtained and stored for the medical record. The tube was gently flushed and connected to gravity bag drainage. The tube was secured to the skin with 0 Prolene suture. IMPRESSION: Successful placement of trans peritoneal percutaneous cholecystostomy tube for acute gangrenous cholecystitis. The trans peritoneal approach was selected based on the fact that the patient is currently on dual antiplatelet therapy which poses a higher risk for bleeding with a transhepatic approach. Electronically Signed   By: Malachy Moan M.D.   On: 03/10/2023 08:46   CT ABDOMEN PELVIS W CONTRAST Result Date: 03/09/2023 CLINICAL DATA:  Fever and abdominal tendinous. EXAM: CT ABDOMEN AND PELVIS WITH CONTRAST TECHNIQUE: Multidetector CT imaging of the abdomen and pelvis was performed using the standard protocol following bolus administration of intravenous contrast. RADIATION DOSE REDUCTION: This exam was performed according to the departmental dose-optimization program which includes automated exposure control, adjustment  of the mA and/or kV according to patient size and/or use of iterative reconstruction technique. CONTRAST:  OMNIPAQUE IOHEXOL 300 MG/ML  SOLN COMPARISON:  CT abdomen pelvis dated February 24, 2018. FINDINGS: Lower chest: No acute abnormality.  Bibasilar atelectasis. Hepatobiliary: Gallbladder with mild irregular wall thickening and suspected wall discontinuity at the fundus. Small gallstone seen near the gallbladder neck. 2.8 x 3.4 cm irregular low-density lesion with subtle peripheral enhancement in the inferior liver adjacent to the gallbladder fundus (series 3, image 26). Pericholecystic inflammatory change near the gallbladder neck. No other focal liver abnormality. No biliary dilatation. Pancreas: Unremarkable. No pancreatic ductal dilatation or surrounding inflammatory changes. Spleen: Normal in size without focal abnormality. Adrenals/Urinary Tract: 1 cm  left adrenal nodule unchanged since 2020, consistent with benign adenoma. Insert follow-up normal right adrenal gland. No renal calculi or mass. Mild bilateral hydroureteronephrosis with markedly distended bladder. Stomach/Bowel: Stomach is within normal limits. Appendix appears normal. No evidence of bowel wall thickening, distention, or inflammatory changes. Diffuse colonic diverticulosis. Vascular/Lymphatic: Aortic atherosclerosis. No enlarged abdominal or pelvic lymph nodes. Reproductive: Mild prostatomegaly with TURP defect noted. Other: No abdominal wall hernia or abnormality. No abdominopelvic ascites. No pneumoperitoneum. Musculoskeletal: No acute or significant osseous findings. IMPRESSION: 1. Acute cholecystitis with probable gallbladder perforation at the fundus. 3.4 cm irregular low-density lesion with subtle peripheral enhancement in the inferior liver adjacent to the gallbladder fundus, concerning for developing hepatic abscess. 2. Mild bilateral hydroureteronephrosis with markedly distended bladder, consistent with bladder outlet obstruction 3.  Aortic Atherosclerosis (ICD10-I70.0). Electronically Signed   By: Obie Dredge M.D.   On: 03/09/2023 16:24   CT HEAD WO CONTRAST ( ) Result Date: 03/09/2023 CLINICAL DATA:  Recent severe. Evaluate for intracranial hemorrhage. EXAM: CT HEAD WITHOUT CONTRAST TECHNIQUE: Contiguous axial images were obtained from the base of the skull through the vertex without intravenous contrast. RADIATION DOSE REDUCTION: This exam was performed according to the departmental dose-optimization program which includes automated exposure control, adjustment of the mA and/or kV according to patient size and/or use of iterative reconstruction technique. COMPARISON:  Head CT dated 02/11/2023. FINDINGS: Brain: Moderate age-related atrophy and chronic microvascular ischemic changes. Old appearing small right periventricular lacunar infarct. There is no acute intracranial  hemorrhage. No mass effect or midline shift. No extra-axial fluid collection. Vascular: No hyperdense vessel or unexpected calcification. Skull: Normal. Negative for fracture or focal lesion. Sinuses/Orbits: No acute finding. Other: None IMPRESSION: 1. No acute intracranial pathology. 2. Moderate age-related atrophy and chronic microvascular ischemic changes. Electronically Signed   By: Elgie Collard M.D.   On: 03/09/2023 16:16   DG Chest 2 View Result Date: 03/09/2023 CLINICAL DATA:  Dyspnea EXAM: CHEST - 2 VIEW COMPARISON:  08/01/2010 chest radiograph. FINDINGS: Stable cardiomediastinal silhouette with mild cardiomegaly. No pneumothorax. No pleural effusion. No overt pulmonary edema. Streaky and patchy mild bibasilar lung opacities. IMPRESSION: 1. Mild cardiomegaly. No overt pulmonary edema. 2. Streaky and patchy mild bibasilar lung opacities, favor atelectasis, difficult to exclude a component of aspiration or pneumonia. Electronically Signed   By: Delbert Phenix M.D.   On: 03/09/2023 15:02      Labs: BNP (last 3 results) Recent Labs    03/09/23 1517  BNP 183.5*   Basic Metabolic Panel: Recent Labs  Lab 03/13/23 0355 03/14/23 0512 03/15/23 0546 03/16/23 0508 03/18/23 0606  NA 138 140  --  139  --   K 2.7* 3.4* 3.4* 3.4* 3.4*  CL 101 104  --  107  --   CO2 29  26  --  24  --   GLUCOSE 118* 108*  --  119*  --   BUN 15 16  --  15  --   CREATININE 0.63 0.62  --  0.62  --   CALCIUM 7.2* 7.6*  --  8.2*  --   MG 1.8 2.0  --  2.1  --    Liver Function Tests: No results for input(s): "AST", "ALT", "ALKPHOS", "BILITOT", "PROT", "ALBUMIN" in the last 168 hours. No results for input(s): "LIPASE", "AMYLASE" in the last 168 hours. No results for input(s): "AMMONIA" in the last 168 hours. CBC: Recent Labs  Lab 03/13/23 0355 03/14/23 0512  WBC 9.5 9.0  HGB 12.4* 13.1  HCT 35.8* 37.9*  MCV 85.0 85.2  PLT 134* 157   Cardiac Enzymes: No results for input(s): "CKTOTAL", "CKMB",  "CKMBINDEX", "TROPONINI" in the last 168 hours. BNP: Invalid input(s): "POCBNP" CBG: No results for input(s): "GLUCAP" in the last 168 hours. D-Dimer No results for input(s): "DDIMER" in the last 72 hours. Hgb A1c No results for input(s): "HGBA1C" in the last 72 hours. Lipid Profile No results for input(s): "CHOL", "HDL", "LDLCALC", "TRIG", "CHOLHDL", "LDLDIRECT" in the last 72 hours. Thyroid function studies No results for input(s): "TSH", "T4TOTAL", "T3FREE", "THYROIDAB" in the last 72 hours.  Invalid input(s): "FREET3" Anemia work up No results for input(s): "VITAMINB12", "FOLATE", "FERRITIN", "TIBC", "IRON", "RETICCTPCT" in the last 72 hours. Urinalysis    Component Value Date/Time   COLORURINE AMBER (A) 03/09/2023 1518   APPEARANCEUR HAZY (A) 03/09/2023 1518   LABSPEC 1.016 03/09/2023 1518   PHURINE 5.0 03/09/2023 1518   GLUCOSEU NEGATIVE 03/09/2023 1518   HGBUR MODERATE (A) 03/09/2023 1518   BILIRUBINUR NEGATIVE 03/09/2023 1518   KETONESUR NEGATIVE 03/09/2023 1518   PROTEINUR 30 (A) 03/09/2023 1518   NITRITE NEGATIVE 03/09/2023 1518   LEUKOCYTESUR TRACE (A) 03/09/2023 1518   Sepsis Labs Recent Labs  Lab 03/13/23 0355 03/14/23 0512  WBC 9.5 9.0   Microbiology Recent Results (from the past 240 hours)  Blood culture (routine x 2)     Status: None   Collection Time: 03/09/23  3:17 PM   Specimen: BLOOD RIGHT ARM  Result Value Ref Range Status   Specimen Description BLOOD RIGHT ARM  Final   Special Requests   Final    BOTTLES DRAWN AEROBIC AND ANAEROBIC Blood Culture results may not be optimal due to an inadequate volume of blood received in culture bottles   Culture   Final    NO GROWTH 5 DAYS Performed at [facility], [address] [place], [place] [address]    Report Status 03/14/2023 FINAL  Final  Blood culture (routine x 2)     Status: None   Collection Time: 03/09/23  3:17 PM   Specimen: BLOOD LEFT ARM  Result Value Ref Range Status    Specimen Description BLOOD LEFT ARM  Final   Special Requests   Final    BOTTLES DRAWN AEROBIC AND ANAEROBIC Blood Culture adequate volume   Culture   Final    NO GROWTH 5 DAYS Performed at [facility], [address] [place], [place] [address]    Report Status 03/14/2023 FINAL  Final  Resp panel by RT-PCR (RSV, Flu A&B, Covid) Anterior Nasal Swab     Status: None   Collection Time: 03/09/23  3:17 PM   Specimen: Anterior Nasal Swab  Result Value Ref Range Status   SARS Coronavirus 2 by RT PCR NEGATIVE NEGATIVE Final  Comment: (NOTE) SARS-CoV-2 target nucleic acids are NOT DETECTED.  The SARS-CoV-2 RNA is generally detectable in upper respiratory specimens during the acute phase of infection. The lowest concentration of SARS-CoV-2 viral copies this assay can detect is 138 copies/mL. A negative result does not preclude SARS-Cov-2 infection and should not be used as the sole basis for treatment or other patient management decisions. A negative result may occur with  improper specimen collection/handling, submission of specimen other than nasopharyngeal swab, presence of viral mutation(s) within the areas targeted by this assay, and inadequate number of viral copies(<138 copies/mL). A negative result must be combined with clinical observations, patient history, and epidemiological information. The expected result is Negative.  Fact Sheet for Patients:  BloggerCourse.com  Fact Sheet for Healthcare Providers:  SeriousBroker.it  This test is no t yet approved or cleared by the [place] FDA and  has been authorized for detection and/or diagnosis of SARS-CoV-2 by FDA under an Emergency Use Authorization (EUA). This EUA will remain  in effect (meaning this test can be used) for the duration of the COVID-19 declaration under Section 564(b)(1) of the Act, 21 U.S.C.section 360bbb-3(b)(1), unless the authorization is  terminated  or revoked sooner.       Influenza A by PCR NEGATIVE NEGATIVE Final   Influenza B by PCR NEGATIVE NEGATIVE Final    Comment: (NOTE) The Xpert Xpress SARS-CoV-2/FLU/RSV plus assay is intended as an aid in the diagnosis of influenza from Nasopharyngeal swab specimens and should not be used as a sole basis for treatment. Nasal washings and aspirates are unacceptable for Xpert Xpress SARS-CoV-2/FLU/RSV testing.  Fact Sheet for Patients: BloggerCourse.com  Fact Sheet for Healthcare Providers: SeriousBroker.it  This test is not yet approved or cleared by the [place] FDA and has been authorized for detection and/or diagnosis of SARS-CoV-2 by FDA under an Emergency Use Authorization (EUA). This EUA will remain in effect (meaning this test can be used) for the duration of the COVID-19 declaration under Section 564(b)(1) of the Act, 21 U.S.C. section 360bbb-3(b)(1), unless the authorization is terminated or revoked.     Resp Syncytial Virus by PCR NEGATIVE NEGATIVE Final    Comment: (NOTE) Fact Sheet for Patients: BloggerCourse.com  Fact Sheet for Healthcare Providers: SeriousBroker.it  This test is not yet approved or cleared by the [place] FDA and has been authorized for detection and/or diagnosis of SARS-CoV-2 by FDA under an Emergency Use Authorization (EUA). This EUA will remain in effect (meaning this test can be used) for the duration of the COVID-19 declaration under Section 564(b)(1) of the Act, 21 U.S.C. section 360bbb-3(b)(1), unless the authorization is terminated or revoked.  Performed at [facility], [address] [place], [place] [address]   Urine Culture     Status: Abnormal   Collection Time: 03/09/23  3:18 PM   Specimen: Urine, Clean Catch  Result Value Ref Range Status   Specimen Description   Final    URINE, CLEAN  CATCH Performed at [facility], [address] [place], [place] [address]    Special Requests   Final    NONE Performed at [facility], [address] [place], [place] [address]    Culture MULTIPLE SPECIES PRESENT, SUGGEST RECOLLECTION (A)  Final   Report Status 03/10/2023 FINAL  Final  Aerobic/Anaerobic Culture w Gram Stain (surgical/deep wound)     Status: None   Collection Time: 03/09/23  7:10 PM   Specimen: BILE  Result Value Ref Range Status  Specimen Description   Final    BILE Performed at [facility], [address] [place], [place] [address]    Special Requests   Final    Normal Performed at [facility], [address] [place], [place] [address]    Gram Stain NO WBC SEEN MODERATE GRAM NEGATIVE RODS   Final   Culture   Final    ABUNDANT KLEBSIELLA PNEUMONIAE NO ANAEROBES ISOLATED Performed at [facility], [address], [place], [place] [address]    Report Status 03/15/2023 FINAL  Final   Organism ID, Bacteria KLEBSIELLA PNEUMONIAE  Final      Susceptibility   Klebsiella pneumoniae - MIC*    AMPICILLIN >=32 RESISTANT Resistant     CEFEPIME <=0.12 SENSITIVE Sensitive     CEFTAZIDIME <=1 SENSITIVE Sensitive     CEFTRIAXONE <=0.25 SENSITIVE Sensitive     CIPROFLOXACIN <=0.25 SENSITIVE Sensitive     GENTAMICIN <=1 SENSITIVE Sensitive     IMIPENEM 0.5 SENSITIVE Sensitive     TRIMETH/SULFA <=20 SENSITIVE Sensitive     AMPICILLIN/SULBACTAM 4 SENSITIVE Sensitive     PIP/TAZO <=4 SENSITIVE Sensitive ug/mL    * ABUNDANT KLEBSIELLA PNEUMONIAE     Total time spend on discharging this patient, including the last patient exam, discussing the hospital stay, instructions for ongoing care as it relates to all pertinent caregivers, as well as preparing the medical discharge records, prescriptions, and/or referrals as applicable, is 35 minutes.    Darlin Priestly, MD  [facility] 03/19/2023, 2:20 PM

## 2023-03-19 NOTE — Plan of Care (Signed)

## 2023-03-19 NOTE — TOC Progression Note (Signed)
 Transition of Care [facility]) - Progression Note    Patient Details  Name: Francisco Reid MRN: [id number] Date of Birth: [birth year]  Transition of Care [facility] CM/SW Contact  Marlowe Sax, RN Phone Number: 03/19/2023, 2:49 PM  Clinical Narrative:     Patient will DC to [place] [facility] room [facility] called ems to transport [name] his nephew is aware       Expected Discharge Plan and Services         Expected Discharge Date: 03/19/23                                     Social Determinants of Health (SDOH) Interventions SDOH Screenings   Food Insecurity: No Food Insecurity (03/11/2023)  Housing: Low Risk  (03/11/2023)  Transportation Needs: No Transportation Needs (03/11/2023)  Utilities: Not At Risk (03/11/2023)  Social Connections: Patient Declined (03/11/2023)  Tobacco Use: Medium Risk (03/11/2023)    Readmission Risk Interventions     No data to display

## 2023-04-10 ENCOUNTER — Inpatient Hospital Stay: Payer: Medicare Other | Admitting: Surgery

## 2023-04-15 ENCOUNTER — Inpatient Hospital Stay: Payer: Medicare Other | Admitting: Surgery

## 2023-04-30 DEATH — deceased

## 2023-05-07 ENCOUNTER — Telehealth: Payer: Self-pay | Admitting: Neurology

## 2023-05-07 NOTE — Telephone Encounter (Signed)
 Patients Nephew advised deceased

## 2023-05-08 NOTE — Telephone Encounter (Signed)
 noted

## 2023-05-14 ENCOUNTER — Ambulatory Visit: Payer: Medicare Other | Admitting: Neurology
